# Patient Record
Sex: Female | Born: 1965 | Race: Asian | Hispanic: No | Marital: Single | Smoking: Never smoker
Health system: Southern US, Community
[De-identification: ages and names within clinical notes are randomized; demographics above are authoritative.]

## PROBLEM LIST (undated history)

## (undated) DIAGNOSIS — E039 Hypothyroidism, unspecified: Secondary | ICD-10-CM

## (undated) DIAGNOSIS — E785 Hyperlipidemia, unspecified: Secondary | ICD-10-CM

## (undated) DIAGNOSIS — E162 Hypoglycemia, unspecified: Secondary | ICD-10-CM

## (undated) DIAGNOSIS — R7303 Prediabetes: Secondary | ICD-10-CM

## (undated) DIAGNOSIS — K219 Gastro-esophageal reflux disease without esophagitis: Secondary | ICD-10-CM

## (undated) DIAGNOSIS — G4700 Insomnia, unspecified: Secondary | ICD-10-CM

## (undated) DIAGNOSIS — M797 Fibromyalgia: Secondary | ICD-10-CM

## (undated) HISTORY — DX: Hypothyroidism, unspecified: E03.9

## (undated) HISTORY — DX: Prediabetes: R73.03

## (undated) HISTORY — DX: Fibromyalgia: M79.7

## (undated) HISTORY — PX: FACIAL RECONSTRUCTION SURGERY: SHX631

## (undated) HISTORY — DX: Hypoglycemia, unspecified: E16.2

## (undated) HISTORY — PX: APPENDECTOMY: SHX54

## (undated) HISTORY — PX: LAPAROTOMY: SHX154

## (undated) HISTORY — DX: Insomnia, unspecified: G47.00

## (undated) HISTORY — DX: Hyperlipidemia, unspecified: E78.5

## (undated) HISTORY — PX: UTERINE FIBROID SURGERY: SHX826

## (undated) HISTORY — PX: VAGINAL HYSTERECTOMY: SUR661

---

## 2001-10-18 ENCOUNTER — Other Ambulatory Visit: Admission: RE | Admit: 2001-10-18 | Discharge: 2001-10-18 | Payer: Self-pay | Admitting: *Deleted

## 2001-11-14 ENCOUNTER — Encounter: Admission: RE | Admit: 2001-11-14 | Discharge: 2001-11-14 | Payer: Self-pay | Admitting: *Deleted

## 2001-11-14 ENCOUNTER — Encounter: Payer: Self-pay | Admitting: *Deleted

## 2002-05-01 ENCOUNTER — Other Ambulatory Visit: Admission: RE | Admit: 2002-05-01 | Discharge: 2002-05-01 | Payer: Self-pay | Admitting: *Deleted

## 2002-08-15 ENCOUNTER — Inpatient Hospital Stay (HOSPITAL_COMMUNITY): Admission: RE | Admit: 2002-08-15 | Discharge: 2002-08-17 | Payer: Self-pay | Admitting: *Deleted

## 2002-08-15 ENCOUNTER — Encounter (INDEPENDENT_AMBULATORY_CARE_PROVIDER_SITE_OTHER): Payer: Self-pay | Admitting: Specialist

## 2002-12-19 ENCOUNTER — Other Ambulatory Visit: Admission: RE | Admit: 2002-12-19 | Discharge: 2002-12-19 | Payer: Self-pay | Admitting: *Deleted

## 2004-09-29 ENCOUNTER — Ambulatory Visit: Payer: Self-pay | Admitting: Physician Assistant

## 2010-01-21 ENCOUNTER — Ambulatory Visit (HOSPITAL_BASED_OUTPATIENT_CLINIC_OR_DEPARTMENT_OTHER): Admission: RE | Admit: 2010-01-21 | Discharge: 2010-01-21 | Payer: Self-pay | Admitting: Rheumatology

## 2010-01-25 ENCOUNTER — Ambulatory Visit: Payer: Self-pay | Admitting: Internal Medicine

## 2011-05-07 NOTE — Discharge Summary (Signed)
   [name], [name]                          ACCOUNT NO.:  [id number]   MEDICAL RECORD NO.:  [id number]                   PATIENT TYPE:  INP   LOCATION:  9311                                 FACILITY:  [facility]   PHYSICIAN:  Gerri Spore B. Earlene Plater, M.D.               DATE OF BIRTH:  10-28-1966   DATE OF ADMISSION:  08/15/2002  DATE OF DISCHARGE:  08/17/2002                                 DISCHARGE SUMMARY   ADMISSION DIAGNOSIS:  Chronic pelvic pain, history of pelvic adhesions and  endometriosis.   DISCHARGE DIAGNOSIS:  Chronic pelvic pain, history of pelvic adhesions and  endometriosis.   PROCEDURE:  Exploratory laparotomy, lysis of adhesions, appendectomy.   HISTORY OF PRESENT ILLNESS:  A 45 year old [place] female gravida 0 with a  long history of endometriosis previously diagnosed by laparoscopy and known  history of dense pelvic adhesions from the rectosigmoid to the posterior  uterus.  The patient presented for conservative surgical management of  recurrent and chronic pelvic pain.   HOSPITAL COURSE:  On day of admission the patient underwent the above  procedure.  Findings at the time of surgery included multiple small bowel  adhesions, adhesions of the distal descending and proximal sigmoid colon to  the uterine fundus, and dense adhesions of the sigmoid and upper rectum to  the uterus in the posterior cul-de-sac; atrophic-appearing ovaries.  Tubes  appeared grossly distorted due to adhesions.  The small bowel adhesions were  dissected sharply per Dr. Carolynne Edouard.  He also performed appendectomy.  The dense  adhesions in the posterior cul-de-sac could not be dissected sharply due to  concern over need for colostomy versus segmental resection and  reanastomosis.   Postoperatively, the patient rapidly regained her ability to ambulate and  void.  She had a mild postoperative ileus which resolved by postoperative  day #2 with expectant management.  She was discharged home on  postoperative  day #2 in satisfactory condition.   DISCHARGE MEDICATIONS:  1. Talacen one p.o. q.4h. p.r.n. pain.  2. Fosamax 70 mg p.o. every week.   DISCHARGE INSTRUCTIONS:  1. No heavy lifting for six weeks, no driving for two weeks.  2. Notify for fever, pain, nausea, vomiting, or concern regarding wound.    FOLLOW-UP:  At the office, Dr. Earlene Plater, in four weeks.   DISPOSITION AT DISCHARGE:  Satisfactory.                                                Gerri Spore B. Earlene Plater, M.D.    WBD/MEDQ  D:  08/17/2002  T:  08/18/2002  Job:  [id number]   cc:   Ollen Gross. Vernell Morgans, M.D.  Fax: [phone]

## 2011-05-07 NOTE — Op Note (Signed)
[name], [name]                          ACCOUNT NO.:  [id number]   MEDICAL RECORD NO.:  [id number]                   PATIENT TYPE:  INP   LOCATION:  9311                                 [facility]ACILITY:  [facility]   PHYSICIAN:  Ollen Gross. Vernell Morgans, M.D.              DATE OF BIRTH:  04-02-1966   DATE OF PROCEDURE:  08/15/2002  DATE OF DISCHARGE:                                 OPERATIVE REPORT   PREOPERATIVE DIAGNOSIS:  Chronic pelvic pain, possible endometriosis.   POSTOPERATIVE DIAGNOSIS:  Chronic pelvic pain with adhesions.   PROCEDURES:  1. Exploratory laparotomy.  2. Lysis of adhesions.   SURGEON:  Ollen Gross. Carolynne Edouard, M.D.   ASSISTANT:  Chester Holstein. Earlene Plater, M.D.   ANESTHESIA:  General endotracheal.   PROCEDURE:  After informed consent was obtained, the patient was brought to  the operating room, placed in the supine position on the operating table.  After adequate induction of general anesthesia, the patient's abdomen was  prepped with Betadine and draped in the usual sterile manner.  A lower  midline incision was made with a 10 blade knife.  This incision was carried  down to the skin and subcutaneous tissues with the Bovie electrocautery  until the linea alba was identified.  The linea alba was also incised with  M__________  electrocautery and careful blunt dissection was then carried  out in the preperitoneal space until access was gained of the abdominal  cavity.  At this point, the abdominal cavity was probed and there appeared  to be no adhesions to the anterior abdominal wall.  The rest of the incision  was then opened.  Under direct vision, a Balfour retractor was placed with a  bladder blade to help retract the edges of the wound laterally and  inferiorly.  At this point, the omentum was able to be elevated and the  small bowel underneath it appeared to be very free and mobile.  On following  the small bowel down into the pelvis, it appeared to have some significant  adhesions  of small bowel to the top of the uterus and to other loops of  bowel.  Very tedious careful dissection both bluntly and sharply with the  Metzenbaum scissors was carried out taking about an hour and a half's worth  of time.  The small bowel was able to be freed completely from the top of  the uterus so that the small bowel was free from the ligament of Treitz to  the ileocecal valve.  Because of the amount of adhesions, it was felt that  it was safer to do an appendectomy because returning to this area if she  should develop an appendicitis in the future would be extremely difficult.  The appendix was long and thin and was in a retrocecal position.  This was  freed up sharply with the Metzenbaum scissors.  The mesoappendix was then  taken  down by clamping with hemostats, dividing and ligating with 3-0 silk  ties.  Once the base of the appendix was completely free, a straight  hemostat was placed across the base of the appendix and then moved distally  a couple of mm and reclamped.  A 0 chromic was then tied along the base of  the appendix where the prior clamp had been.  The appendix was then divided  and removed from the patient.  The appendiceal stump was then dunked with a  Z-stitch of 3-0 silk.  Attention was then turned to the sigmoid colon which  was significantly adherent to the lateral side wall as well as the edge of  the uterus and after careful dissection, this was able to be freed.  The  ureters were identified on both sides and care was taken to stay away from  the course of the ureter.  The rectum appeared to be very intimately  adherent to the back wall of the uterus.  There was no obvious evidence of  endometriosis in the pelvis and because of the significant adhesion deep  within the pelvis between the uterus and the rectum, it was decided to stop  the dissection at this point rather than risk any injury to the rectum.  Certainly should she need to have a hysterectomy in  future, this dissection  will be very difficult, but she was not ready for this much of an operation  today.  Some Interceed barrier was placed on the dome of the uterus.  Prior  to that, the entered abdomen was irrigated with copious amounts of saline.  The pelvis was examined and found to be hemostatic.  The Interceed was then  placed on the dome of the uterus and the bowel was allowed to fall back in  place and covered with the omentum.  The abdominal wall was then closed with  two running 1 PDS sutures.  The subcutaneous tissue was then irrigated with  copious amounts of saline and the skin was closed with a running 4-0 Vicryl  subcuticular stitch.  Sterile dressings were applied.  The patient tolerated  the procedure well.  At the end of the case, all sponge, instrument, and  needle counts were correct.  The patient was awakened and taken to the  recovery room in stable condition.                                               Ollen Gross. Vernell Morgans, M.D.    PST/MEDQ  D:  08/15/2002  T:  08/15/2002  Job:  [id number]

## 2011-05-07 NOTE — H&P (Signed)
[name], [name]                          ACCOUNT NO.:  [id number]   MEDICAL RECORD NO.:  [id number]                   PATIENT TYPE:  INP   LOCATION:  NA                                   [facility]ILITY[facility]  [facility]   PHYSICIAN:  [place] B. Earlene Plater, M.D.               DATE OF BIRTH:  [birth year]   DATE OF ADMISSION:  08/15/2002  DATE OF DISCHARGE:                                HISTORY & PHYSICAL   PREOPERATIVE DIAGNOSES:  1. Endometriosis.  2. Recurrent pelvic pain.   INTENDED PROCEDURE:  Exploratory laparotomy, lysis of adhesions, resection  of endometriosis, potential for bowel resection.   HISTORY OF PRESENT ILLNESS:  The patient is a 45 year old [place] female,  gravida 0, with a long history of endometriosis previously diagnosed by  laparoscopy and found to be very advanced with adhesions from the  rectosigmoid to the posterior uterus.  The patient has been managed with  Depo-Lupron in the past, however, this led to osteoporosis.  Has  subsequently been managed with birth control pills, nonsteroidals, and  narcotic pain medication.  The patient has continued severe pain and  requests surgical treatment.  Would like to retain her uterus for future  child-bearing if at all possible, but expressed understanding that  hysterectomy may be the only way to resect endometriosis causing her pain.   PAST MEDICAL HISTORY:  Endometriosis as outlined above.   PAST SURGICAL HISTORY:  1. Myomectomy x2.  2. LEEP for cervical dysplasia.   MEDICATIONS:  1. Fosamax 70 mg a week.  2. Talwin for pain.   ALLERGIES:  To most CODEINE BASED ANALGESICS, PAXIL, ZOLOFT.   FAMILY HISTORY:  Hypertension, diabetes, hypertension, and heart disease.   REVIEW OF SYMPTOMS:  Otherwise negative.   PHYSICAL EXAMINATION:  VITAL SIGNS:  Blood pressure 140/82, pulse 84,  temperature 98.6.  GENERAL:  Alert and oriented, in no acute distress.  SKIN:  Warm and dry, no lesions.  HEART:  Regular rate and rhythm.  LUNGS:  Clear to auscultation.  ABDOMEN:  No inguinal hernia, moderate bilateral lower abdominal pain.  PELVIC:  Normal external genitalia and cervix normal.  Uterus is fixed, non-  mobile, and no adnexal masses are palpable.   ASSESSMENT:  Recurrent endometriosis with associated pain.  The patient  desires surgical treatment.  She is most interested in conservative therapy,  preserving uterus and ovary if possible, however, has expressed willingness  to proceed with hysterectomy if it is the only way to optimally remove the  endometriosis.  The patient has been made aware for potential need for bowel  resection, and will have a bowel prep preoperatively, and will have surgery  performed in conjunction with Dr. Carolynne Edouard of general surgery, anticipating this  likelihood.  Operative risks discussed, including infection, bleeding,  damage to bowel, bladder, or surrounding organs.  The patient wishes to  proceed.  Gerri Spore B. Earlene Plater, M.D.    WBD/MEDQ  D:  07/27/2002  T:  07/28/2002  Job:  [id number]   cc:   Ollen Gross. Vernell Morgans, M.D.  Fax: [phone]

## 2020-03-25 ENCOUNTER — Other Ambulatory Visit: Payer: Self-pay | Admitting: Obstetrics & Gynecology

## 2020-03-27 ENCOUNTER — Encounter: Payer: Self-pay | Admitting: Gastroenterology

## 2020-04-01 ENCOUNTER — Other Ambulatory Visit: Payer: Self-pay | Admitting: Gastroenterology

## 2020-04-29 ENCOUNTER — Ambulatory Visit: Payer: Self-pay | Admitting: Gastroenterology

## 2020-05-05 NOTE — Progress Notes (Signed)
Called to ask about study. Pt states she wishes to just have the manometry not the PH study that was scheduled. Sent message to dr. Rozanna Boer to let him know.

## 2020-05-06 ENCOUNTER — Emergency Department
Admission: EM | Admit: 2020-05-06 | Discharge: 2020-05-06 | Disposition: A | Discharge status: Home / Self Care | Payer: 59 | Source: Home / Self Care | Attending: Family Medicine | Admitting: Family Medicine

## 2020-05-06 ENCOUNTER — Other Ambulatory Visit: Payer: Self-pay

## 2020-05-06 DIAGNOSIS — M791 Myalgia, unspecified site: Secondary | ICD-10-CM | POA: Diagnosis not present

## 2020-05-06 DIAGNOSIS — K209 Esophagitis, unspecified without bleeding: Secondary | ICD-10-CM

## 2020-05-06 DIAGNOSIS — K121 Other forms of stomatitis: Secondary | ICD-10-CM | POA: Diagnosis not present

## 2020-05-06 HISTORY — DX: Gastro-esophageal reflux disease without esophagitis: K21.9

## 2020-05-06 LAB — POCT CBC W AUTO DIFF (K'VILLE URGENT CARE)

## 2020-05-06 LAB — POCT URINALYSIS DIP (MANUAL ENTRY)
Bilirubin, UA: NEGATIVE
Blood, UA: NEGATIVE
Glucose, UA: NEGATIVE mg/dL
Ketones, POC UA: NEGATIVE mg/dL
Leukocytes, UA: NEGATIVE
Nitrite, UA: NEGATIVE
Protein Ur, POC: NEGATIVE mg/dL
Spec Grav, UA: 1.02 (ref 1.010–1.025)
Urobilinogen, UA: 0.2 E.U./dL
pH, UA: 6 (ref 5.0–8.0)

## 2020-05-06 MED ORDER — SUCRALFATE 1 GM/10ML PO SUSP
1.0000 g | Freq: Four times a day (QID) | ORAL | 1 refills | Status: DC
Start: 2020-05-06 — End: 2021-09-22

## 2020-05-06 MED ORDER — SODIUM CHLORIDE 0.9 % IV BOLUS
1000.0000 mL | Freq: Once | INTRAVENOUS | Status: AC
  Administered 2020-05-06: 1000 mL via INTRAVENOUS

## 2020-05-06 MED ORDER — LIDOCAINE VISCOUS HCL 2 % MT SOLN
15.0000 mL | Freq: Once | OROMUCOSAL | Status: AC
  Administered 2020-05-06: 15 mL via ORAL

## 2020-05-06 MED ORDER — ALUM & MAG HYDROXIDE-SIMETH 200-200-20 MG/5ML PO SUSP
30.0000 mL | Freq: Once | ORAL | Status: AC
  Administered 2020-05-06: 30 mL via ORAL

## 2020-05-06 NOTE — ED Triage Notes (Signed)
Patient presents to Urgent Care with complaints of sore throat and generalized body aches since she had an endoscopy four days ago. Patient reports her pain in her throat is worse when swallowing even though she is on a liquid diet. Pt has been pulling forward on her neck when she swallows to ease the pain and her neck hurts by the end of the day. Patient states she feels dehydrated.

## 2020-05-06 NOTE — Discharge Instructions (Addendum)
Try Biotene oral rinse for dry mouth.  Increase oral intake as tolerated.  If symptoms become significantly worse during the night or over the weekend, proceed to the local emergency room.
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
## 2020-05-07 LAB — COMPLETE METABOLIC PANEL WITH GFR
AG Ratio: 1.7 (calc) (ref 1.0–2.5)
ALT: 8 U/L (ref 6–29)
AST: 13 U/L (ref 10–35)
Albumin: 4.2 g/dL (ref 3.6–5.1)
Alkaline phosphatase (APISO): 52 U/L (ref 37–153)
BUN/Creatinine Ratio: 9 (calc) (ref 6–22)
BUN: 5 mg/dL — ABNORMAL LOW (ref 7–25)
CO2: 24 mmol/L (ref 20–32)
Calcium: 9.4 mg/dL (ref 8.6–10.4)
Chloride: 106 mmol/L (ref 98–110)
Creat: 0.54 mg/dL (ref 0.50–1.05)
GFR, Est African American: 125 mL/min/{1.73_m2} (ref >=60)
GFR, Est Non African American: 108 mL/min/{1.73_m2} (ref >=60)
Globulin: 2.5 g/dL (calc) (ref 1.9–3.7)
Glucose, Bld: 105 mg/dL — ABNORMAL HIGH (ref 65–99)
Potassium: 3.8 mmol/L (ref 3.5–5.3)
Sodium: 140 mmol/L (ref 135–146)
Total Bilirubin: 0.7 mg/dL (ref 0.2–1.2)
Total Protein: 6.7 g/dL (ref 6.1–8.1)

## 2020-05-07 LAB — CK: Total CK: 43 U/L (ref 29–143)

## 2020-05-08 ENCOUNTER — Telehealth: Payer: Self-pay

## 2020-05-08 LAB — URINE CULTURE
MICRO NUMBER:: 10490472
Result:: NO GROWTH
SPECIMEN QUALITY:: ADEQUATE

## 2020-05-08 NOTE — Telephone Encounter (Signed)
Pt called to get a further explanation of lab work. Pt also requested to be sent to PCP. Advised PCP may be able to see or she would need to come pick up labs. Pt advised labs were mostly normal, may repeat at PCP office if sxs persist.

## 2020-05-10 ENCOUNTER — Other Ambulatory Visit (HOSPITAL_COMMUNITY): Payer: Self-pay

## 2020-07-02 ENCOUNTER — Ambulatory Visit (HOSPITAL_COMMUNITY): Admit: 2020-07-02 | Payer: 59 | Admitting: Gastroenterology

## 2020-07-02 ENCOUNTER — Encounter (HOSPITAL_COMMUNITY): Payer: Self-pay

## 2020-07-02 SURGERY — MANOMETRY, ESOPHAGUS

## 2020-10-29 ENCOUNTER — Telehealth: Payer: Self-pay | Admitting: Gastroenterology

## 2020-10-29 NOTE — Telephone Encounter (Signed)
Ok, please schedule next available appointment.  Thank you

## 2020-10-29 NOTE — Telephone Encounter (Signed)
Hey Dr Lavon Paganini, this pt is being referred by [facility] (Dr Opal Sidles) for SIBO, he last saw this GI 10/02/2020, records are in epic for review, please advise on scheduling.

## 2020-10-31 ENCOUNTER — Ambulatory Visit (HOSPITAL_COMMUNITY)
Admission: RE | Admit: 2020-10-31 | Discharge: 2020-10-31 | Disposition: A | Discharge status: Home / Self Care | Payer: 59 | Source: Ambulatory Visit | Attending: Obstetrics & Gynecology | Admitting: Obstetrics & Gynecology

## 2020-10-31 ENCOUNTER — Other Ambulatory Visit: Payer: Self-pay

## 2020-10-31 ENCOUNTER — Other Ambulatory Visit (HOSPITAL_COMMUNITY): Payer: Self-pay | Admitting: Obstetrics & Gynecology

## 2020-10-31 ENCOUNTER — Encounter: Payer: Self-pay | Admitting: Gastroenterology

## 2020-10-31 DIAGNOSIS — R252 Cramp and spasm: Secondary | ICD-10-CM | POA: Insufficient documentation

## 2020-11-03 ENCOUNTER — Other Ambulatory Visit: Payer: Self-pay

## 2020-11-03 ENCOUNTER — Emergency Department
Admission: EM | Admit: 2020-11-03 | Discharge: 2020-11-03 | Disposition: A | Discharge status: Home / Self Care | Payer: 59 | Source: Home / Self Care

## 2020-11-03 ENCOUNTER — Emergency Department (INDEPENDENT_AMBULATORY_CARE_PROVIDER_SITE_OTHER): Payer: 59

## 2020-11-03 ENCOUNTER — Emergency Department: Admission: EM | Admit: 2020-11-03 | Discharge: 2020-11-03 | Discharge status: Absent without leave | Payer: Self-pay

## 2020-11-03 DIAGNOSIS — B9689 Other specified bacterial agents as the cause of diseases classified elsewhere: Secondary | ICD-10-CM

## 2020-11-03 DIAGNOSIS — R6883 Chills (without fever): Secondary | ICD-10-CM

## 2020-11-03 DIAGNOSIS — M79622 Pain in left upper arm: Secondary | ICD-10-CM

## 2020-11-03 DIAGNOSIS — M79602 Pain in left arm: Secondary | ICD-10-CM

## 2020-11-03 DIAGNOSIS — M25512 Pain in left shoulder: Secondary | ICD-10-CM

## 2020-11-03 DIAGNOSIS — M791 Myalgia, unspecified site: Secondary | ICD-10-CM

## 2020-11-03 DIAGNOSIS — J019 Acute sinusitis, unspecified: Secondary | ICD-10-CM | POA: Diagnosis not present

## 2020-11-03 DIAGNOSIS — S00412A Abrasion of left ear, initial encounter: Secondary | ICD-10-CM

## 2020-11-03 MED ORDER — AMOXICILLIN-POT CLAVULANATE 875-125 MG PO TABS: 1.0000 | ORAL_TABLET | Freq: Two times a day (BID) | ORAL | 0 refills | Status: DC

## 2020-11-03 MED ORDER — NEOMYCIN-POLYMYXIN-HC 3.5-10000-1 OT SUSP
3.0000 [drp] | Freq: Three times a day (TID) | OTIC | 0 refills | Status: AC
Start: 2020-11-03 — End: 2020-11-10

## 2020-11-03 NOTE — ED Triage Notes (Signed)
Pt c/o LT ear pain x 2 days. Noticed some blood on q tip this am. Some facial pain, specifically behind her eyes. Also c/o chest congestion. Mucinex BID x 2 weeks. Hx of reflux. Also c/o muscles aches x several months, worsening in last week.

## 2020-11-03 NOTE — ED Provider Notes (Addendum)
[facility]    CSN: [id number] Arrival date & time: 11/03/20  0902      History   Chief Complaint Chief Complaint  Patient presents with  . Generalized Body Aches  . Otalgia    LT ear  . Nasal Congestion    Chest congestion    HPI [name] is a 54 y.o. female.   HPI  [name] is a 54 y.o. female presenting to UC with several concerns.  Congestion/ear pain: pt c/o several weeks of congestion including post-nasal drainage and sinus pressure.  She has been taking mucinex and zyrtec with mild relief.  Two days ago, she developed Left ear pain and noticed blood on a Q-tip this morning.  Associated facial pain behind her eyes but denies change in vision.  Mild chest congestion with cough but denies chest pain or SOB. Denies fever, chills, n/v/d.   Left upper arm pain and leg "coolness": pt was seen by PCP on 10/21/20 for same. She had nasal congestion at that time, was advised to continue the mucinex and zyrtec as needed. She was also evaluated for the arm pain, referred to cardiology, who ruled out cardiac cause at this time but pt is scheduled for a cardiac CT tomorrow.  PCP also encouraged use of Tylenol and baclofen for muscle spasms. She initially thought the arm pain was muscular but states "it feels like it's in the bone now." Per medical records, pt developed a bad headache on the top of her head, had nausea, numbness and heaviness in Left arm. Pt has not been evaluated by neurology. Today, pt states she attributed those symptoms to low blood sugar and states the HA was gradual in onset and not severe. intermittent headaches since then.  HA today is mild sinus pressure but denies change in vision or dizziness. Pt medical records, pt had an MRI of her neck on 04/04/20 due to pressure and pain in throat, ear and chest.  MRI was normal at that time. She is concerned her arm pain is from having the IV placed for that procedure.  Denies neck pain today.    Past  Medical History:  Diagnosis Date  . GERD (gastroesophageal reflux disease)     There are no problems to display for this patient.   History reviewed. No pertinent surgical history.  OB History   No obstetric history on file.      Home Medications    Prior to Admission medications   Medication Sig Start Date End Date Taking? Authorizing Provider  famotidine (PEPCID) 40 MG tablet Take by mouth. 04/22/20  Yes [provider]  zolpidem (AMBIEN) 10 MG tablet Take by mouth. 07/24/13  Yes [provider]  amoxicillin-clavulanate (AUGMENTIN) 875-125 MG tablet Take 1 tablet by mouth 2 (two) times daily. One po bid x 7 days 11/03/20   Lurene Shadow, PA-C  atenolol (TENORMIN) 25 MG tablet Take 12.5 mg by mouth daily.    [provider]  baclofen (LIORESAL) 10 MG tablet Take 5 mg by mouth 3 (three) times daily.    [provider]  estradiol (CLIMARA - DOSED IN MG/24 HR) 0.1 mg/24hr patch Place 0.1 mg onto the skin once a week.    [provider]  levothyroxine (SYNTHROID) 50 MCG tablet Take 50 mcg by mouth daily before breakfast.    [provider]  neomycin-polymyxin-hydrocortisone (CORTISPORIN) 3.5-10000-1 OTIC suspension Place 3 drops into the left ear 3 (three) times daily for 7 days.  11/03/20 11/10/20  Lurene ShadowPhelps, Giordana Weinheimer O, PA-C  sucralfate (CARAFATE) 1 GM/10ML suspension Take 10 mLs (1 g total) by mouth 4 (four) times daily for 21 days. 05/06/20 05/27/20  Lattie HawBeese, Stephen A, MD  zolpidem (AMBIEN) 10 MG tablet Take 10 mg by mouth at bedtime as needed for sleep.    [provider]    Family History Family History  Problem Relation Age of Onset  . Diabetes Mother   . Hypertension Mother   . Heart failure Mother   . Hypertension Father     Social History Social History   Tobacco Use  . Smoking status: Never Smoker  . Smokeless tobacco: Never Used  Substance Use Topics  . Alcohol use: Not Currently  . Drug use: Not on file      Allergies   Other   Review of Systems Review of Systems  Constitutional: Negative for chills and fever.  HENT: Positive for congestion, ear pain, sinus pressure and sore throat. Negative for trouble swallowing and voice change.   Eyes: Negative for photophobia and visual disturbance.  Respiratory: Positive for cough (mild). Negative for shortness of breath.   Cardiovascular: Negative for chest pain and palpitations.  Gastrointestinal: Negative for abdominal pain, diarrhea, nausea and vomiting.  Musculoskeletal: Positive for arthralgias and myalgias. Negative for back pain.       Left upper arm pain  Skin: Negative for rash.  Neurological: Positive for headaches. Negative for dizziness, weakness, light-headedness and numbness.       "coolness sensation" in arms and legs  All other systems reviewed and are negative.    Physical Exam Triage Vital Signs ED Triage Vitals  Enc Vitals Group     BP 11/03/20 0918 130/79     Pulse Rate 11/03/20 0918 90     Resp 11/03/20 0918 18     Temp 11/03/20 0918 98.1 F (36.7 C)     Temp Source 11/03/20 0918 Oral     SpO2 11/03/20 0918 100 %     Weight --      Height --      Head Circumference --      Peak Flow --      Pain Score 11/03/20 0919 5     Pain Loc --      Pain Edu? --      Excl. in GC? --    No data found.  Updated Vital Signs BP 130/79 (BP Location: Right Arm)   Pulse 90   Temp 98.1 F (36.7 C) (Oral)   Resp 18   SpO2 100%   Visual Acuity Right Eye Distance:   Left Eye Distance:   Bilateral Distance:    Right Eye Near:   Left Eye Near:    Bilateral Near:     Physical Exam Vitals and nursing note reviewed.  Constitutional:      General: She is not in acute distress.    Appearance: Normal appearance. She is well-developed. She is not ill-appearing, toxic-appearing or diaphoretic.  HENT:     Head: Normocephalic and atraumatic.     Right Ear: Tympanic membrane and ear canal normal.     Left Ear: Tympanic  membrane and ear canal normal. Drainage present. No tenderness. There is no impacted cerumen. No foreign body.     Ears:     Comments: Left ear: scant red blood in canal. TM in tact.     Nose:     Right Sinus: Maxillary sinus tenderness present. No frontal sinus tenderness.  Left Sinus: Maxillary sinus tenderness present. No frontal sinus tenderness.     Mouth/Throat:     Lips: Pink.     Mouth: Mucous membranes are moist.     Pharynx: Oropharynx is clear. Uvula midline. No pharyngeal swelling, oropharyngeal exudate, posterior oropharyngeal erythema or uvula swelling.  Eyes:     Extraocular Movements: Extraocular movements intact.     Conjunctiva/sclera: Conjunctivae normal.     Pupils: Pupils are equal, round, and reactive to light.  Cardiovascular:     Rate and Rhythm: Normal rate and regular rhythm.  Pulmonary:     Effort: Pulmonary effort is normal. No respiratory distress.     Breath sounds: Normal breath sounds. No stridor. No wheezing, rhonchi or rales.  Musculoskeletal:        General: Tenderness (left shoulder and left upper arm) present. No swelling. Normal range of motion.     Cervical back: Normal range of motion and neck supple. No tenderness.     Comments: No spinal tenderness. Full ROM upper and lower extremities with 5/5 strength. Normal gait.   Lymphadenopathy:     Cervical: No cervical adenopathy.  Skin:    General: Skin is warm and dry.     Capillary Refill: Capillary refill takes less than 2 seconds.  Neurological:     General: No focal deficit present.     Mental Status: She is alert and oriented to person, place, and time.     Cranial Nerves: No cranial nerve deficit.     Sensory: No sensory deficit.     Motor: No weakness.     Coordination: Coordination normal.     Gait: Gait normal.  Psychiatric:        Mood and Affect: Mood normal.        Behavior: Behavior normal.      UC Treatments / Results  Labs (all labs ordered are listed, but only  abnormal results are displayed) Labs Reviewed - No data to display  EKG   Radiology DG Shoulder Left  Result Date: 11/03/2020 CLINICAL DATA:  Left upper extremity pain and weakness since May 2021. No injury. EXAM: LEFT SHOULDER - 2+ VIEW COMPARISON:  None. FINDINGS: There is no evidence of fracture or dislocation. There is no evidence of arthropathy or other focal bone abnormality. Soft tissues are unremarkable. IMPRESSION: Negative. Electronically Signed   By: Elberta Fortis M.D.   On: 11/03/2020 12:42   DG Humerus Left  Result Date: 11/03/2020 CLINICAL DATA:  Left upper arm pain intermittently since May 2021. No injury. EXAM: LEFT HUMERUS - 2+ VIEW COMPARISON:  None. FINDINGS: There is no evidence of fracture or other focal bone lesions. Soft tissues are unremarkable. IMPRESSION: Negative. Electronically Signed   By: Elberta Fortis M.D.   On: 11/03/2020 12:42    Procedures Procedures (including critical care time)  Medications Ordered in UC Medications - No data to display  Initial Impression / Assessment and Plan / UC Course  I have reviewed the triage vital signs and the nursing notes.  Pertinent labs & imaging results that were available during my care of the patient were reviewed by me and considered in my medical decision making (see chart for details).    Hx and exam c/w bacterial sinusitis given sinus tenderness and duration of congestion.  Rx: augmentin Ear exam, left- scant red blood in canal c/w superficial abrasion, will tx with cortisporin drops   Left arm pain- plain films ordered as pt c/o worsening pain over the last  2 weeks, "feels like its in the bone."  No bony abnormality noted today. Reviewed PMH and discussed HA that started around same time as arm pain. Pt declined CT head today, pt has cardiac CT tomorrow, pt concerned for radiation.  Normal neuro exam today in UC.  Pt does note concern for MS, no family hx. Due to reports of "chills" in legs, continued  Left upper arm pain and headaches, will place referral to neurology.  Discussed symptoms that warrant emergent care in the ED. AVS given   Final Clinical Impressions(s) / UC Diagnoses   Final diagnoses:  Acute bacterial sinusitis  Abrasion of left ear canal, initial encounter  Myalgia  Left shoulder pain  Left arm pain  Chills     Discharge Instructions      Please take antibiotics as prescribed and be sure to complete entire course even if you start to feel better to ensure infection does not come back.  Call to schedule a follow up appointment with your primary care provider later this week or next week for recheck of symptoms and further evaluation of leg and arm pain and chills.  You may also try to schedule a follow up appointment with neurology or your endocrinologist to discuss your arm pain and chilling sensation in your arms and legs.  Be sure to keep your cardiology appointment for the CT tomorrow.    Call 911 or have someone drive you to the hospital if symptoms significantly worsening including severe headache, change in vision, weakness in arms or legs especially if one sided, or other new concerning symptoms develop.      ED Prescriptions    Medication Sig Dispense Auth. Provider   amoxicillin-clavulanate (AUGMENTIN) 875-125 MG tablet Take 1 tablet by mouth 2 (two) times daily. One po bid x 7 days 14 tablet Zaya Kessenich O, PA-C   neomycin-polymyxin-hydrocortisone (CORTISPORIN) 3.5-10000-1 OTIC suspension Place 3 drops into the left ear 3 (three) times daily for 7 days. 10 mL Lurene Shadow, PA-C     PDMP not reviewed this encounter.   Lurene Shadow, PA-C 11/03/20 1642    Lurene Shadow, PA-C 11/03/20 1643

## 2020-11-03 NOTE — ED Notes (Signed)
Pt was requested to return for further eval after provider further reviewed other PCP and specialists notes from previous visits regarding todays complaints.

## 2020-11-03 NOTE — Discharge Instructions (Addendum)
  Please take antibiotics as prescribed and be sure to complete entire course even if you start to feel better to ensure infection does not come back.  Call to schedule a follow up appointment with your primary care provider later this week or next week for recheck of symptoms and further evaluation of leg and arm pain and chills.  You may also try to schedule a follow up appointment with neurology or your endocrinologist to discuss your arm pain and chilling sensation in your arms and legs.  Be sure to keep your cardiology appointment for the CT tomorrow.    Call 911 or have someone drive you to the hospital if symptoms significantly worsening including severe headache, change in vision, weakness in arms or legs especially if one sided, or other new concerning symptoms develop.

## 2020-11-20 ENCOUNTER — Other Ambulatory Visit: Payer: Self-pay | Admitting: Neurology

## 2020-11-20 DIAGNOSIS — R2990 Unspecified symptoms and signs involving the nervous system: Secondary | ICD-10-CM

## 2020-11-24 ENCOUNTER — Other Ambulatory Visit: Payer: 59

## 2020-11-29 ENCOUNTER — Other Ambulatory Visit: Payer: 59

## 2020-12-01 ENCOUNTER — Other Ambulatory Visit: Payer: 59

## 2020-12-09 ENCOUNTER — Ambulatory Visit: Payer: 59

## 2020-12-22 ENCOUNTER — Other Ambulatory Visit: Payer: 59

## 2020-12-24 ENCOUNTER — Ambulatory Visit (INDEPENDENT_AMBULATORY_CARE_PROVIDER_SITE_OTHER): Payer: 59 | Admitting: Gastroenterology

## 2020-12-24 ENCOUNTER — Encounter: Payer: Self-pay | Admitting: Gastroenterology

## 2020-12-24 VITALS — BP 114/70 | HR 80 | Ht <= 58 in | Wt 88.0 lb

## 2020-12-24 DIAGNOSIS — R12 Heartburn: Secondary | ICD-10-CM | POA: Diagnosis not present

## 2020-12-24 DIAGNOSIS — K580 Irritable bowel syndrome with diarrhea: Secondary | ICD-10-CM

## 2020-12-24 DIAGNOSIS — K589 Irritable bowel syndrome without diarrhea: Secondary | ICD-10-CM | POA: Diagnosis not present

## 2020-12-24 DIAGNOSIS — R634 Abnormal weight loss: Secondary | ICD-10-CM

## 2020-12-24 DIAGNOSIS — K6389 Other specified diseases of intestine: Secondary | ICD-10-CM

## 2020-12-24 MED ORDER — RIFAXIMIN 550 MG PO TABS
550.0000 mg | ORAL_TABLET | Freq: Two times a day (BID) | ORAL | 0 refills | Status: DC
Start: 2020-12-24 — End: 2020-12-25

## 2020-12-24 NOTE — Patient Instructions (Addendum)
You have been scheduled for an endoscopy. Please follow written instructions given to you at your visit today. If you use inhalers (even only as needed), please bring them with you on the day of your procedure.   Take IBGard and FDgard three times a day as needed  We will send Xifaxan to your pharmacy to take twice a day for 14 days, we have  Submitted Xifaxan to [facility] because your insurance will not cover the medication  We have sent your demographic information and a prescription for Xifaxan to [facility]. This pharmacy is able to get medication approved through insurance and get you the lowest copay possible. If you have not heard from them within 1 week, please call our office at [phone] to let us know.  Take Benefiber 1 teaspoon 2-3 times daily with meals   Due to recent changes in healthcare laws, you may see the results of your imaging and laboratory studies on MyChart before your provider has had a chance to review them.  We understand that in some cases there may be results that are confusing or concerning to you. Not all laboratory results come back in the same time frame and the provider may be waiting for multiple results in order to interpret others.  Please give [place] 48 hours in order for your provider to thoroughly review all the results before contacting the office for clarification of your results.   I appreciate the  opportunity to care for you  Thank You   Marsa Aris , MD

## 2020-12-24 NOTE — Progress Notes (Signed)
[name]    [id number]    21-Aug-1966  Primary Care Physician:Dsa, Laurell Roof, MD  Referring Physician: Alferd Apa, MD [address] [place],  [place] [address]   Chief complaint:  GERD, Abdominal pain, SIBO  HPI:  55 yr-year-old very pleasant female here for new patient visit with complaints of abdominal bloating, GERD, abdominal pain, and excess gas. She was previously followed by GI in [place], has had extensive work-up which is unrevealing for any acute pathology  Complains of burning sensation in mid abdomen associated with excessive gas and bloating, worse after meals.  "She feels like the gas and the burning sensation goes all the way to her head"  She has been taking probiotics on and off in the past 1 year with no significant change in symptoms  She had colonoscopy with biopsies at [facility] in May 2021, full report not available in epic  Breath hydrogen test: positve  Hydrogen increase over baseline 37  (Normal <20)  Methane peak 11 (<10)   She was treated with 1 week course of amoxicillin, has not noticed any significant improvement  She takes Phazyme as needed for excessive gas, not sure if it helps or not.  Review of system positive for back pain, fatigue, cramps, and sleeping problem  She has had multiple imaging including MRI enterography, CT abdomen pelvis  No vomiting, melena or rectal bleeding.  Outpatient Encounter Medications as of 12/24/2020  Medication Sig  . AMBULATORY NON FORMULARY MEDICATION DGL Plus 1 capsule 2-3 times daily  . AMBULATORY NON FORMULARY MEDICATION GI Mocrobi-X Take 1 capsule twice a day as needed  . AMBULATORY NON FORMULARY MEDICATION Interface Plus 1 capsule 1-2 times daily as needed  . atenolol (TENORMIN) 25 MG tablet Take 12.5 mg by mouth daily.  . Calcium Carb-Cholecalciferol (CALCIUM 1000 + D) 1000-800 MG-UNIT TABS Take 1 tablet by mouth as needed.  . cholecalciferol (VITAMIN D3) 25 MCG (1000  UNIT) tablet Take 1 tablet by mouth. Three times a week as needed  . cyanocobalamin (,VITAMIN B-12,) 1000 MCG/ML injection Inject 1,000 mcg into the muscle. twice a month  . estradiol (ESTRACE) 1 MG tablet Take 0.5 mg by mouth daily.  . famotidine (PEPCID) 40 MG tablet Take by mouth.  . levothyroxine (SYNTHROID) 50 MCG tablet Take 50 mcg by mouth daily before breakfast.  . pantoprazole (PROTONIX) 20 MG tablet Take 1 tablet by mouth 2 (two) times daily as needed.  . pioglitazone (ACTOS) 15 MG tablet Take 1 tablet by mouth daily.  . pravastatin (PRAVACHOL) 20 MG tablet Take 20 mg by mouth daily.  . rifaximin (XIFAXAN) 550 MG TABS tablet Take 1 tablet by mouth in the morning and at bedtime.  . sucralfate (CARAFATE) 1 GM/10ML suspension Take 10 mLs (1 g total) by mouth 4 (four) times daily for 21 days. (Patient taking differently: Take 1 g by mouth as needed.)  . tiZANidine (ZANAFLEX) 4 MG tablet Take 1 tablet by mouth as needed.  . zolpidem (AMBIEN) 10 MG tablet Take 10 mg by mouth at bedtime as needed for sleep.  . [DISCONTINUED] amoxicillin-clavulanate (AUGMENTIN) 875-125 MG tablet Take 1 tablet by mouth 2 (two) times daily. One po bid x 7 days  . [DISCONTINUED] baclofen (LIORESAL) 10 MG tablet Take 5 mg by mouth 3 (three) times daily.  . [DISCONTINUED] estradiol (CLIMARA - DOSED IN MG/24 HR) 0.1 mg/24hr patch Place 0.1 mg onto the skin once a week.  . [  DISCONTINUED] zolpidem (AMBIEN) 10 MG tablet Take by mouth.   No facility-administered encounter medications on file as of 12/24/2020.    Allergies as of 12/24/2020 - Review Complete 12/24/2020  Allergen Reaction Noted  . Lexapro [escitalopram] Itching 12/24/2020  . Other  05/06/2020  . Soma [carisoprodol] Itching 12/24/2020  . Tramadol Itching 12/24/2020    Past Medical History:  Diagnosis Date  . Fibromyalgia   . GERD (gastroesophageal reflux disease)   . HLD (hyperlipidemia)   . Hypoglycemia   . Hypothyroidism   . Insomnia   .  Prediabetes     No past surgical history on file.  Family History  Problem Relation Age of Onset  . Diabetes Mother   . Hypertension Mother   . Heart failure Mother   . Hypertension Father     Social History   Socioeconomic History  . Marital status: Married    Spouse name: Not on file  . Number of children: Not on file  . Years of education: Not on file  . Highest education level: Not on file  Occupational History  . Not on file  Tobacco Use  . Smoking status: Never Smoker  . Smokeless tobacco: Never Used  Substance and Sexual Activity  . Alcohol use: Not Currently  . Drug use: Not on file  . Sexual activity: Not on file  Other Topics Concern  . Not on file  Social History Narrative  . Not on file   Social Determinants of Health   Financial Resource Strain: Not on file  Food Insecurity: Not on file  Transportation Needs: Not on file  Physical Activity: Not on file  Stress: Not on file  Social Connections: Not on file  Intimate Partner Violence: Not on file      Review of systems: All other review of systems negative except as mentioned in the HPI.   Physical Exam: Vitals:   12/24/20 1350  BP: 114/70  Pulse: 80   Body mass index is 18.55 kg/m. 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 All questions answered. The patient agrees to proceed.  Use FD guard and IBgard for dyspepsia and IBS symptoms up to 3 times daily as needed  Use Benefiber 1 teaspoon 3 times daily with meals to improve bowel habits and decrease flatulence   The patient was provided an opportunity to ask questions and all were answered. The patient agreed with the plan and demonstrated an understanding of the instructions.  Iona Beard , MD    CC: Alferd Apa, MD

## 2020-12-25 MED ORDER — RIFAXIMIN 550 MG PO TABS: 550.0000 mg | ORAL_TABLET | Freq: Two times a day (BID) | ORAL | 0 refills | Status: DC

## 2020-12-26 ENCOUNTER — Telehealth: Payer: Self-pay | Admitting: Gastroenterology

## 2020-12-26 NOTE — Telephone Encounter (Signed)
We have submitted xifaxan to [facility] to see if they can get it cheaper for the patient

## 2020-12-26 NOTE — Telephone Encounter (Signed)
Pt is requesting a cheaper alternative for her Xifaxan.

## 2021-01-06 ENCOUNTER — Encounter: Payer: Self-pay | Admitting: Gastroenterology

## 2021-01-06 NOTE — Telephone Encounter (Signed)
Pt called inquiring about status of this prescription. Pls call her.

## 2021-01-07 NOTE — Telephone Encounter (Signed)
Dr Lavon Paganini, [facility] has samples for this patient, but you had prescribed it for twice a day for 14 days  Is that what you wanted to do? For SIBO its usually three times a day for 14 days

## 2021-01-07 NOTE — Telephone Encounter (Signed)
[facility], Do we have samples of Xifaxan I can give pt?

## 2021-01-08 NOTE — Telephone Encounter (Signed)
We can hold off until after she completes all the testing. Thanks

## 2021-01-08 NOTE — Telephone Encounter (Signed)
Called patient to inform to hold off on taking the xifaxan until after her EGD in Feb

## 2021-01-13 ENCOUNTER — Telehealth: Payer: Self-pay | Admitting: Gastroenterology

## 2021-01-13 NOTE — Telephone Encounter (Signed)
Dr Lavon Paganini patient needs to cancel EGD/[facility] due to insurance reasons

## 2021-01-13 NOTE — Telephone Encounter (Signed)
ok 

## 2021-01-14 NOTE — Telephone Encounter (Signed)
Patient picked up samples of Xifaxan 42 tablets for treatment yesterday

## 2021-01-19 ENCOUNTER — Ambulatory Visit: Payer: 59

## 2021-01-23 ENCOUNTER — Encounter: Payer: 59 | Admitting: Gastroenterology

## 2021-02-13 ENCOUNTER — Ambulatory Visit
Admission: RE | Admit: 2021-02-13 | Discharge: 2021-02-13 | Disposition: A | Discharge status: Home / Self Care | Payer: 59 | Source: Ambulatory Visit | Attending: Neurology | Admitting: Neurology

## 2021-02-13 ENCOUNTER — Other Ambulatory Visit: Payer: Self-pay | Admitting: Neurology

## 2021-02-13 ENCOUNTER — Other Ambulatory Visit: Payer: Self-pay

## 2021-02-13 DIAGNOSIS — R2990 Unspecified symptoms and signs involving the nervous system: Secondary | ICD-10-CM | POA: Insufficient documentation

## 2021-03-19 ENCOUNTER — Ambulatory Visit (INDEPENDENT_AMBULATORY_CARE_PROVIDER_SITE_OTHER): Payer: 59 | Admitting: Otolaryngology

## 2021-04-03 ENCOUNTER — Ambulatory Visit (INDEPENDENT_AMBULATORY_CARE_PROVIDER_SITE_OTHER): Payer: 59 | Admitting: Otolaryngology

## 2021-04-19 ENCOUNTER — Encounter: Payer: Self-pay | Admitting: Emergency Medicine

## 2021-04-19 ENCOUNTER — Emergency Department
Admission: EM | Admit: 2021-04-19 | Discharge: 2021-04-19 | Disposition: A | Discharge status: Home / Self Care | Payer: 59 | Source: Home / Self Care

## 2021-04-19 ENCOUNTER — Other Ambulatory Visit: Payer: Self-pay

## 2021-04-19 DIAGNOSIS — Z4802 Encounter for removal of sutures: Secondary | ICD-10-CM

## 2021-04-19 NOTE — ED Triage Notes (Signed)
Patient had staples placed from laceration in head x 8 days at [facility] ED.  Patient is here to have staples removed.

## 2021-04-24 ENCOUNTER — Ambulatory Visit (INDEPENDENT_AMBULATORY_CARE_PROVIDER_SITE_OTHER): Payer: 59 | Admitting: Gastroenterology

## 2021-04-24 ENCOUNTER — Encounter: Payer: Self-pay | Admitting: Gastroenterology

## 2021-04-24 VITALS — BP 118/70 | HR 84 | Ht <= 58 in | Wt 90.1 lb

## 2021-04-24 DIAGNOSIS — K219 Gastro-esophageal reflux disease without esophagitis: Secondary | ICD-10-CM | POA: Diagnosis not present

## 2021-04-24 NOTE — Progress Notes (Signed)
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 It is unclear if her symptoms improved after taking Xifaxan, unable to obtain a clear answer though she does indicate overall improvement in how she feels.   She also started using "Prawal panchamrit rasa, ayurvedic medicine" She feels that the second she took Ayurvedic medicine  gas went away", she is having bitter taste as a side effect to the medicine and her practioner changed the formulary, she is waiting for her shipment. She is able to eat better, is tolerating food better.  No longer losing weight.  She feels she continues to have difficulty digesting certain foods She continues to have significant heartburn, especially worse when she lays down, she feels acid come up on in the right side of her throat, her gum and teeth hurt and her sputum turns white.  She also feels burning sensation in both her thighs, calves and arms when she starts having acid come up her throat. Pantoprazole and Pepcid makes her symptoms of acid reflux worse  She was recommended to undergo EGD along with 48-hour pH Bravo study, patient canceled the procedure that was scheduled during last visit  GI Hx: Breath hydrogen test: positve  Hydrogen increase over baseline 37  (Normal <20)  Methane peak 11 (<10)   She has had multiple imaging including MRI enterography, CT abdomen pelvis done by her previous GI in [place]  She had colonoscopy with biopsies at [facility] in May 2021, was normal . Full report not available in epic   Outpatient  Encounter Medications as of 04/24/2021  Medication Sig  . atenolol (TENORMIN) 25 MG tablet Take 12.5 mg by mouth daily.  . Calcium Carb-Cholecalciferol (CALCIUM 1000 + D) 1000-800 MG-UNIT TABS Take 1 tablet by mouth as needed.  . cholecalciferol (VITAMIN D3) 25 MCG (1000 UNIT) tablet Take 1 tablet by mouth. Three times a week as needed  . cyanocobalamin (,VITAMIN B-12,) 1000 MCG/ML injection Inject 1,000 mcg into the muscle. twice a month  . estradiol (ESTRACE) 1 MG tablet Take 0.5 mg by mouth daily.  . famotidine (PEPCID) 40 MG tablet Take by mouth.  . levothyroxine (SYNTHROID) 50 MCG tablet Take 50 mcg by mouth daily before breakfast.  . pantoprazole (PROTONIX) 20 MG tablet Take 1 tablet by mouth 2 (two) times daily as needed.  . pioglitazone (ACTOS) 15 MG tablet Take 1 tablet by mouth daily.  Marland Kitchen zolpidem (AMBIEN) 10 MG tablet Take 10 mg by mouth at bedtime as needed for sleep.  . pravastatin (PRAVACHOL) 20 MG tablet Take 20 mg by mouth daily.  . sucralfate (CARAFATE) 1 GM/10ML suspension Take 10 mLs (1 g total) by mouth 4 (four) times daily for 21 days. (Patient taking differently: Take 1 g by mouth as needed.)  . [DISCONTINUED] AMBULATORY NON FORMULARY MEDICATION DGL Plus 1 capsule 2-3 times daily (Patient not taking: Reported on 04/24/2021)  . [DISCONTINUED] AMBULATORY NON FORMULARY MEDICATION GI Mocrobi-X Take 1 capsule twice a day as needed  . [DISCONTINUED] AMBULATORY NON FORMULARY MEDICATION Interface Plus 1 capsule 1-2 times  daily as needed (Patient not taking: Reported on 04/24/2021)  . [DISCONTINUED] rifaximin (XIFAXAN) 550 MG TABS tablet Take 1 tablet by mouth in the morning and at bedtime. (Patient not taking: Reported on 04/24/2021)  . [DISCONTINUED] rifaximin (XIFAXAN) 550 MG TABS tablet Take 1 tablet (550 mg total) by mouth 2 (two) times daily. For 14 days (Patient not taking: Reported on 04/24/2021)   No facility-administered encounter medications on file as of 04/24/2021.    Allergies  as of 04/24/2021 - Review Complete 04/24/2021  Allergen Reaction Noted  . Gadolinium derivatives Hives 02/13/2021  . Lexapro [escitalopram] Itching 12/24/2020  . Other  05/06/2020  . Soma [carisoprodol] Itching 12/24/2020  . Tramadol Itching 12/24/2020    Past Medical History:  Diagnosis Date  . Fibromyalgia   . GERD (gastroesophageal reflux disease)   . HLD (hyperlipidemia)   . Hypoglycemia   . Hypothyroidism   . Insomnia   . Prediabetes     Past Surgical History:  Procedure Laterality Date  . APPENDECTOMY    . FACIAL RECONSTRUCTION SURGERY    . LAPAROTOMY    . UTERINE FIBROID SURGERY    . VAGINAL HYSTERECTOMY     complete    Family History  Problem Relation Age of Onset  . Diabetes Mother   . Hypertension Mother   . Heart failure Mother   . Irritable bowel syndrome Mother   . Hypothyroidism Mother   . Atrial fibrillation Mother   . Hyperlipidemia Mother   . Hypertension Father   . Heart disease Father   . Hyperlipidemia Father   . Diabetes Maternal Grandmother   . Hypertension Maternal Grandmother   . Hypertension Maternal Grandfather   . Hypertension Paternal Grandmother   . Hypertension Paternal Grandfather     Social History   Socioeconomic History  . Marital status: Married    Spouse name: Not on file  . Number of children: 0  . Years of education: Not on file  . Highest education level: Not on file  Occupational History  . Occupation: retired  Tobacco Use  . Smoking status: Never Smoker  . Smokeless tobacco: Never Used  Vaping Use  . Vaping Use: Never used  Substance and Sexual Activity  . Alcohol use: Not Currently  . Drug use: Never  . Sexual activity: Not on file  Other Topics Concern  . Not on file  Social History Narrative  . Not on file   Social Determinants of Health   Financial Resource Strain: Not on file  Food Insecurity: Not on file  Transportation Needs: Not on file  Physical Activity: Not on file  Stress: Not on file   Social Connections: Not on file  Intimate Partner Violence: Not on file      Review of systems: All other review of systems negative except as mentioned in the HPI.   Physical Exam: Vitals:   04/24/21 1048  BP: 118/70  Pulse: 84  SpO2: 99%   Body mass index is 19 kg/m. 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  Patient overall appears to be doing better  She continues to have heartburn and and symptoms of acid reflux Schedule for EGD with 48-hour pH Bravo study to differentiate between functional heartburn versus pathologic gastroesophageal acid reflux Hold PPI and H2 blocker given no improvement of symptoms  Return as needed  The risks and benefits as well as alternatives of endoscopic procedure(s) have been discussed and reviewed. All questions answered. The patient agrees to proceed.   This visit required 40 minutes of patient care (this includes precharting, chart review, review of results, face-to-face time used for counseling as well as treatment plan and follow-up. The patient was provided an opportunity to ask questions and all were answered. The patient agreed with the plan and demonstrated an understanding of the instructions.  Iona Beard , MD    CC: Alferd Apa, MD

## 2021-04-24 NOTE — Patient Instructions (Addendum)
It has been recommended to you by your physician that you have a(n) EGD/BRAVO completed. Per your request, we did not schedule the procedure(s) today. Please contact our office at [phone] should you decide to have the procedure completed. You will be scheduled for a pre-visit and procedure at that time. ( Wants an Aug appointment, schedules  are not out yet, patient will call back end of next week)  STOP Pepcid  Due to recent changes in healthcare laws, you may see the results of your imaging and laboratory studies on MyChart before your provider has had a chance to review them.  We understand that in some cases there may be results that are confusing or concerning to you. Not all laboratory results come back in the same time frame and the provider may be waiting for multiple results in order to interpret others.  Please give [place] 48 hours in order for your provider to thoroughly review all the results before contacting the office for clarification of your results.   If you are age 52 or older, your body mass index should be between 23-30. Your Body mass index is 19 kg/m. If this is out of the aforementioned range listed, please consider follow up with your Primary Care Provider.  If you are age 77 or younger, your body mass index should be between 19-25. Your Body mass index is 19 kg/m. If this is out of the aformentioned range listed, please consider follow up with your Primary Care Provider.    I appreciate the  opportunity to care for you  Thank You   Marsa Aris , MD

## 2021-05-07 ENCOUNTER — Telehealth: Payer: Self-pay | Admitting: *Deleted

## 2021-05-07 NOTE — Telephone Encounter (Signed)
Left message for patient that she can schedule her appointment for August now.  She needs a EGD with 48 hour PH Bravo done in the [facility] also will need a Previsit set up

## 2021-06-24 NOTE — Telephone Encounter (Signed)
Beth has scheduled patient for 8/3

## 2021-06-30 ENCOUNTER — Telehealth: Payer: Self-pay | Admitting: Gastroenterology

## 2021-06-30 NOTE — Telephone Encounter (Signed)
DOD Patient of Dr Lavon Paganini whom is being seen for heartburn, GERD and is scheduled for BRAVO study. Her PCP drew labs. Patient's amylase is elevated. PCP to her to contact us. Lipase normal. She is very concerned. Would you review and advise? Thank you

## 2021-06-30 NOTE — Telephone Encounter (Signed)
Pt called requesting if another physician could review her amylase levels from her last labs since Dr. Lavon Paganini is on vacation this week. She is also asking for the ICD 10 code for bravo so she can call her insurance to check coverage.

## 2021-07-02 ENCOUNTER — Other Ambulatory Visit: Payer: Self-pay

## 2021-07-02 ENCOUNTER — Emergency Department
Admission: EM | Admit: 2021-07-02 | Discharge: 2021-07-02 | Disposition: A | Discharge status: Home / Self Care | Payer: 59 | Source: Home / Self Care | Attending: Family Medicine | Admitting: Family Medicine

## 2021-07-02 DIAGNOSIS — G8929 Other chronic pain: Secondary | ICD-10-CM

## 2021-07-02 DIAGNOSIS — R748 Abnormal levels of other serum enzymes: Secondary | ICD-10-CM

## 2021-07-02 DIAGNOSIS — M545 Low back pain, unspecified: Secondary | ICD-10-CM

## 2021-07-02 DIAGNOSIS — Z8739 Personal history of other diseases of the musculoskeletal system and connective tissue: Secondary | ICD-10-CM

## 2021-07-02 MED ORDER — KETOROLAC TROMETHAMINE 30 MG/ML IJ SOLN
15.0000 mg | Freq: Once | INTRAMUSCULAR | Status: AC
  Administered 2021-07-02: 15 mg via INTRAMUSCULAR

## 2021-07-02 MED ORDER — METAXALONE 400 MG PO TABS: 400.0000 mg | ORAL_TABLET | Freq: Three times a day (TID) | ORAL | 0 refills | Status: DC

## 2021-07-02 NOTE — ED Notes (Signed)
Pt asking for script to be sent to [facility] since the prescription is too expensive at [facility].

## 2021-07-02 NOTE — ED Triage Notes (Signed)
Pt c/o back pain, worsening in last week. Some numbness in center of back. Pain does radiate down buttocks and RT side of back. Pain 9/10 Cannot take NSAIDs. Heating pad tried with no relief.  Zanaflex prn with little relief.

## 2021-07-02 NOTE — ED Provider Notes (Signed)
[facility]    CSN: [id number] Arrival date & time: 07/02/21  1654      History   Chief Complaint Chief Complaint  Patient presents with   Back Pain    Lower    HPI [name] is a 55 y.o. female.   HPI  Patient has chronic back pain.  She has been seen by [facility].  She has been recommended to have a nerve block.  She states she has scoliosis.  She states she has had worse pain for the last week.  She is trying to move and has multiple boxes.  She states she is being careful about not doing heavy lifting.  The pain is in the right low back.  No nerve pain into the leg.  No numbness or weakness.  No bowel or bladder complaint. She has a lot of medicine intolerance.  States that she prefers not to take any narcotics.  States she cannot take nonsteroidal anti-inflammatory drugs due to her severe GERD.  Is scheduled to have an endoscopy in less than 2 weeks.  Is good about seeing her gastroenterologist.  She does have a family doctor.  Patient states she has to sleep sitting up every night.  States she is unable to sleep well She states she had x-rays in January.  I am unable to find those in my computer chart review. She takes Zanaflex at night.  States she cannot take it during the day.  She is of small stature, weighing under 90 pounds.  Past Medical History:  Diagnosis Date   Fibromyalgia    GERD (gastroesophageal reflux disease)    HLD (hyperlipidemia)    Hypoglycemia    Hypothyroidism    Insomnia    Prediabetes     There are no problems to display for this patient.   Past Surgical History:  Procedure Laterality Date   APPENDECTOMY     FACIAL RECONSTRUCTION SURGERY     LAPAROTOMY     UTERINE FIBROID SURGERY     VAGINAL HYSTERECTOMY     complete    OB History   No obstetric history on file.      Home Medications    Prior to Admission medications   Medication Sig Start Date End Date Taking? Authorizing Provider  atenolol (TENORMIN) 25  MG tablet Take 12.5 mg by mouth daily.    [provider]  Calcium Carb-Cholecalciferol (CALCIUM 1000 + D) 1000-800 MG-UNIT TABS Take 1 tablet by mouth as needed.    [provider]  cholecalciferol (VITAMIN D3) 25 MCG (1000 UNIT) tablet Take 1 tablet by mouth. Three times a week as needed    [provider]  cyanocobalamin (,VITAMIN B-12,) 1000 MCG/ML injection Inject 1,000 mcg into the muscle. twice a month 09/29/20   [provider]  estradiol (ESTRACE) 1 MG tablet Take 0.5 mg by mouth daily.    [provider]  famotidine (PEPCID) 40 MG tablet Take by mouth. 04/22/20   [provider]  levothyroxine (SYNTHROID) 50 MCG tablet Take 50 mcg by mouth daily before breakfast.    [provider]  metaxalone (SKELAXIN) 400 MG tablet Take 1 tablet (400 mg total) by mouth 3 (three) times daily. As needed muscle spasm 07/02/21   Eustace Moore, MD  pantoprazole (PROTONIX) 20 MG tablet Take 1 tablet by mouth 2 (two) times daily as needed. 04/01/20   [provider]  pioglitazone (ACTOS) 15 MG tablet Take 1 tablet by mouth daily.  12/01/20   [provider]  pravastatin (PRAVACHOL) 20 MG tablet Take 20 mg by mouth daily. 09/07/20   [provider]  sucralfate (CARAFATE) 1 GM/10ML suspension Take 10 mLs (1 g total) by mouth 4 (four) times daily for 21 days. Patient taking differently: Take 1 g by mouth as needed. 05/06/20 05/27/20  Lattie Haw, MD  zolpidem (AMBIEN) 10 MG tablet Take 10 mg by mouth at bedtime as needed for sleep.    [provider]    Family History Family History  Problem Relation Age of Onset   Diabetes Mother    Hypertension Mother    Heart failure Mother    Irritable bowel syndrome Mother    Hypothyroidism Mother    Atrial fibrillation Mother    Hyperlipidemia Mother    Hypertension Father    Heart disease Father    Hyperlipidemia Father    Diabetes Maternal Grandmother     Hypertension Maternal Grandmother    Hypertension Maternal Grandfather    Hypertension Paternal Grandmother    Hypertension Paternal Grandfather     Social History Social History   Tobacco Use   Smoking status: Never   Smokeless tobacco: Never  Vaping Use   Vaping Use: Never used  Substance Use Topics   Alcohol use: Not Currently   Drug use: Never     Allergies   Gadolinium derivatives, Lexapro [escitalopram], Other, Soma [carisoprodol], and Tramadol   Review of Systems Review of Systems See HPI  Physical Exam Triage Vital Signs ED Triage Vitals  Enc Vitals Group     BP 07/02/21 1705 138/86     Pulse Rate 07/02/21 1705 85     Resp 07/02/21 1705 18     Temp 07/02/21 1705 98.1 F (36.7 C)     Temp Source 07/02/21 1705 Oral     SpO2 07/02/21 1705 100 %     Weight --      Height --      Head Circumference --      Peak Flow --      Pain Score 07/02/21 1706 9     Pain Loc --      Pain Edu? --      Excl. in GC? --    No data found.  Updated Vital Signs BP 138/86 (BP Location: Right Arm)   Pulse 85   Temp 98.1 F (36.7 C) (Oral)   Resp 18   SpO2 100%     Physical Exam Constitutional:      General: She is not in acute distress.    Appearance: She is well-developed.     Comments: Patient is lean.  Normal gait and movements  HENT:     Head: Normocephalic and atraumatic.     Mouth/Throat:     Comments: Mask is in place Eyes:     Conjunctiva/sclera: Conjunctivae normal.     Pupils: Pupils are equal, round, and reactive to light.  Cardiovascular:     Rate and Rhythm: Normal rate.  Pulmonary:     Effort: Pulmonary effort is normal. No respiratory distress.  Abdominal:     Palpations: Abdomen is soft.  Musculoskeletal:        General: Normal range of motion.     Cervical back: Normal range of motion.     Comments: Tenderness and increased muscle tone is palpable in the right lumbar column of muscles.  No bony tenderness.  No obvious scoliosis to external  examination.  Strength sensation range of  motion reflexes normal lower extremities with negative straight leg raise  Skin:    General: Skin is warm and dry.  Neurological:     Mental Status: She is alert.     UC Treatments / Results  Labs (all labs ordered are listed, but only abnormal results are displayed) Labs Reviewed - No data to display  EKG   Radiology No results found.  Procedures Procedures (including critical care time)  Medications Ordered in UC Medications  ketorolac (TORADOL) 30 MG/ML injection 15 mg (15 mg Intramuscular Given 07/02/21 1743)    Initial Impression / Assessment and Plan / UC Course  I have reviewed the triage vital signs and the nursing notes.  Pertinent labs & imaging results that were available during my care of the patient were reviewed by me and considered in my medical decision making (see chart for details).     Patient is to receive a shot of Toradol.  She would like to try different muscle relaxer and since she is intolerant of sedation we will try Skelaxin.  She should follow-up with her primary care physician for referral to physical therapy.  Final Clinical Impressions(s) / UC Diagnoses   Final diagnoses:  Chronic right-sided low back pain without sciatica  History of scoliosis     Discharge Instructions      I have provided a shot of Toradol for pain relief.  I hope this gives you some comfort Consider trying Zanaflex during the day, I assume the sedation will improve with time.  I also have prescribed Skelaxin to take for a trial..  You will take this or Zanaflex as muscle relaxer.  Skelaxin (metaxalone) as advertised as a lower sedation muscle relaxer Take Tylenol for moderate pain Use ice or heat to painful back Moderate activity Follow-up with your primary care doctor if you need referral to physical therapy or specialty   ED Prescriptions     Medication Sig Dispense Auth. Provider   metaxalone (SKELAXIN) 400 MG  tablet Take 1 tablet (400 mg total) by mouth 3 (three) times daily. As needed muscle spasm 45 tablet Eustace Moore, MD      PDMP not reviewed this encounter.   Eustace Moore, MD 07/02/21 [phone]

## 2021-07-02 NOTE — Telephone Encounter (Signed)
DOD  Isolated amylase elevation Was present previously as well as evident from labs 2011 No mention of abdominal pain in the notes  Like macroamylasemia  Plan: -Please find out if she has any abdominal pain.  If yes, proceed with CT Abdo/pelvis -If no abdominal pain, check urine amylase and urine creatinine. At same time check BMP, Serum amylase to calculate amylase to creatinine clearance ratio (ACCR).  RG

## 2021-07-02 NOTE — Discharge Instructions (Addendum)
I have provided a shot of Toradol for pain relief.  I hope this gives you some comfort Consider trying Zanaflex during the day, I assume the sedation will improve with time.  I also have prescribed Skelaxin to take for a trial..  You will take this or Zanaflex as muscle relaxer.  Skelaxin (metaxalone) as advertised as a lower sedation muscle relaxer Take Tylenol for moderate pain Use ice or heat to painful back Moderate activity Follow-up with your primary care doctor if you need referral to physical therapy or specialty

## 2021-07-02 NOTE — Telephone Encounter (Signed)
Dr Nandigam FYI 

## 2021-07-02 NOTE — Telephone Encounter (Signed)
Spoke with the patient. She tells me her "stomach hurts sometimes, but not always." Explained the plan of investigation. She agrees to the lab work. She declines CT at this time.

## 2021-07-02 NOTE — ED Notes (Signed)
Pharmacy changed per pt. First pharmacy notified of pharmacy change to cancel rx fill.

## 2021-07-03 ENCOUNTER — Other Ambulatory Visit (INDEPENDENT_AMBULATORY_CARE_PROVIDER_SITE_OTHER): Payer: 59

## 2021-07-03 DIAGNOSIS — R748 Abnormal levels of other serum enzymes: Secondary | ICD-10-CM | POA: Diagnosis not present

## 2021-07-03 LAB — BASIC METABOLIC PANEL
BUN: 11 mg/dL (ref 6–23)
CO2: 24 mEq/L (ref 19–32)
Calcium: 8.7 mg/dL (ref 8.4–10.5)
Chloride: 112 mEq/L (ref 96–112)
Creatinine, Ser: 0.58 mg/dL (ref 0.40–1.20)
GFR: 102.54 mL/min (ref >60.00)
Glucose, Bld: 103 mg/dL — ABNORMAL HIGH (ref 70–99)
Potassium: 3.8 mEq/L (ref 3.5–5.1)
Sodium: 142 mEq/L (ref 135–145)

## 2021-07-03 LAB — AMYLASE: Amylase: 61 U/L (ref 27–131)

## 2021-07-04 ENCOUNTER — Telehealth: Payer: Self-pay | Admitting: Emergency Medicine

## 2021-07-04 LAB — AMYLASE, RANDOM URINE: Amylase, Ur: 418 U/L

## 2021-07-04 LAB — CREATININE, URINE, RANDOM: Creatinine, Urine: 230.4 mg/dL

## 2021-07-04 NOTE — Telephone Encounter (Signed)
Patient left message on VM that she cannot afford rx for Skelaxin; requesting alternative. Wants suggestions for analgesic and is having endoscopy next week and cannot take NSAID. Spoke with M.Ragan who asked me to call in rx for Baclofen 10 mg po tid x 10 days for total #30; no refills and to tell patient to take acetaminophen as suggested in AVS and use hot/cold. Left message on patient's VM. Called to [facility] on [address]

## 2021-07-16 ENCOUNTER — Ambulatory Visit (INDEPENDENT_AMBULATORY_CARE_PROVIDER_SITE_OTHER): Payer: 59 | Admitting: Otolaryngology

## 2021-07-16 ENCOUNTER — Other Ambulatory Visit: Payer: Self-pay

## 2021-07-16 DIAGNOSIS — K219 Gastro-esophageal reflux disease without esophagitis: Secondary | ICD-10-CM

## 2021-07-16 DIAGNOSIS — J310 Chronic rhinitis: Secondary | ICD-10-CM | POA: Diagnosis not present

## 2021-07-16 MED ORDER — TRIAMCINOLONE ACETONIDE 55 MCG/ACT NA AERO: 2.0000 | INHALATION_SPRAY | Freq: Every day | NASAL | 12 refills | Status: DC

## 2021-07-16 NOTE — Progress Notes (Addendum)
HPI: [name] is a 55 y.o. female who presents is referred by her PCP for evaluation of multiple complaints as well as recent finding on MRI scan of a T2 hyperintense lesion in the right tonsil pharyngeal region. Patient states that she has had a chronic history of reflux problems and is seen GI and has had previous endoscopy and takes antireflux medication twice daily.  She also complains of congestion in her nose as well as her throat.  She has tried antihistamines without much benefit.  She is presently not on any nasal sprays.  She also complains of bubbling mucus in the right side of the throat as well as occasional discomfort on the right side of her throat.  However she does not have any trouble swallowing.  And does not describe pain or discomfort when drinking liquids.  She also complains of pressure in her ears..  Past Medical History:  Diagnosis Date   Fibromyalgia    GERD (gastroesophageal reflux disease)    HLD (hyperlipidemia)    Hypoglycemia    Hypothyroidism    Insomnia    Prediabetes    Past Surgical History:  Procedure Laterality Date   APPENDECTOMY     FACIAL RECONSTRUCTION SURGERY     LAPAROTOMY     UTERINE FIBROID SURGERY     VAGINAL HYSTERECTOMY     complete   Social History   Socioeconomic History   Marital status: Married    Spouse name: Not on file   Number of children: 0   Years of education: Not on file   Highest education level: Not on file  Occupational History   Occupation: retired  Tobacco Use   Smoking status: Never   Smokeless tobacco: Never  Vaping Use   Vaping Use: Never used  Substance and Sexual Activity   Alcohol use: Not Currently   Drug use: Never   Sexual activity: Not on file  Other Topics Concern   Not on file  Social History Narrative   Not on file   Social Determinants of Health   Financial Resource Strain: Not on file  Food Insecurity: Not on file  Transportation Needs: Not on file  Physical Activity: Not on file   Stress: Not on file  Social Connections: Not on file   Family History  Problem Relation Age of Onset   Diabetes Mother    Hypertension Mother    Heart failure Mother    Irritable bowel syndrome Mother    Hypothyroidism Mother    Atrial fibrillation Mother    Hyperlipidemia Mother    Hypertension Father    Heart disease Father    Hyperlipidemia Father    Diabetes Maternal Grandmother    Hypertension Maternal Grandmother    Hypertension Maternal Grandfather    Hypertension Paternal Grandmother    Hypertension Paternal Grandfather    Allergies  Allergen Reactions   Gadolinium Derivatives Hives    Pre dose prior to contrast scans per Rad   Lexapro [Escitalopram] Itching   Other     Patient does not want any narcotics- makes her nauseous   Soma [Carisoprodol] Itching   Tramadol Itching    Ultram   Prior to Admission medications   Medication Sig Start Date End Date Taking? Authorizing Provider  atenolol (TENORMIN) 25 MG tablet Take 12.5 mg by mouth daily.    [provider]  Calcium Carb-Cholecalciferol (CALCIUM 1000 + D) 1000-800 MG-UNIT TABS Take 1 tablet by mouth as needed.    [provider]  cholecalciferol (VITAMIN D3) 25 MCG (1000 UNIT) tablet Take 1 tablet by mouth. Three times a week as needed    [provider]  cyanocobalamin (,VITAMIN B-12,) 1000 MCG/ML injection Inject 1,000 mcg into the muscle. twice a month 09/29/20   [provider]  estradiol (ESTRACE) 1 MG tablet Take 0.5 mg by mouth daily.    [provider]  famotidine (PEPCID) 40 MG tablet Take by mouth. 04/22/20   [provider]  levothyroxine (SYNTHROID) 50 MCG tablet Take 50 mcg by mouth daily before breakfast.    [provider]  metaxalone (SKELAXIN) 400 MG tablet Take 1 tablet (400 mg total) by mouth 3 (three) times daily. As needed muscle spasm 07/02/21   Eustace Moore, MD  pantoprazole (PROTONIX) 20 MG tablet Take 1 tablet by mouth 2  (two) times daily as needed. 04/01/20   [provider]  pioglitazone (ACTOS) 15 MG tablet Take 1 tablet by mouth daily. 12/01/20   [provider]  pravastatin (PRAVACHOL) 20 MG tablet Take 20 mg by mouth daily. 09/07/20   [provider]  sucralfate (CARAFATE) 1 GM/10ML suspension Take 10 mLs (1 g total) by mouth 4 (four) times daily for 21 days. Patient taking differently: Take 1 g by mouth as needed. 05/06/20 05/27/20  Lattie Haw, MD  zolpidem (AMBIEN) 10 MG tablet Take 10 mg by mouth at bedtime as needed for sleep.    [provider]     Positive ROS: Otherwise negative  All other systems have been reviewed and were otherwise negative with the exception of those mentioned in the HPI and as above.  Physical Exam: Constitutional: Alert, well-appearing, no acute distress Ears: External ears without lesions or tenderness. Ear canals are clear bilaterally.  Both TMs are clear with no middle ear effusion noted. Nasal: External nose without lesions. Septum minimally deviated to the left with mild rhinitis.  After decongesting the nose both middle meatus regions were clear.  No signs of infection no polyps noted intranasally.  On review of her MRI scan of her head this showed clear paranasal sinuses.  With a single left maxillary sinus cyst that is nonobstructing. Oral: Lips and gums without lesions. Tongue and palate mucosa without lesions. Posterior oropharynx clear.  Is difficult to evaluate patient's tonsils on exam in the office as she has a small mouth with elevation of the base of tongue.  Even with using tongue blades could only visualize the superior aspect of the tonsils..  However the tongue buccal mucosa and floor mouth wall clear to examination.  No mucosal abnormalities noted.  Indirect laryngoscopy revealed limited visualization of the base of tongue which was clear. Fiberoptic laryngoscopy was performed to the right nostril.  The nasal cavity was  clear the right side nasopharynx was clear.  The base of tongue was clear with no abnormalities noted.  Vallecula and epiglottis were normal.  Vocal cords were clear with normal vocal mobility bilaterally.  Piriform sinuses were clear. Palpation of the base of tongue and tonsil regions was benign with no induration and no palpable masses. Neck: No palpable adenopathy or masses.  No palpable adenopathy in the neck. Respiratory: Breathing comfortably  Skin: No facial/neck lesions or rash noted.  Laryngoscopy  Date/Time: 07/16/2021 1:54 PM Performed by: Drema Halon, MD Authorized by: Drema Halon, MD   Consent:    Consent obtained:  Verbal   Consent given by:  Patient Procedure details:    Indications: direct visualization of  the upper aerodigestive tract     Medication:  Afrin   Instrument: flexible fiberoptic laryngoscope   Sinus:    Right nasopharynx: normal     Right Eustachian tube orifices: normal   Mouth:    Oropharynx: normal     Vallecula: normal     Base of tongue: normal     Epiglottis: normal   Throat:    True vocal cords: normal   Comments:     On fiberoptic laryngoscopy the nasopharynx was clear.  The base of tongue vallecula and epiglottis were normal.  Vocal cords were clear bilaterally with normal vocal mobility.  Assessment: I suspect GE reflux disease is contributing some to her symptoms. She has clear upper airway examination on fiberoptic laryngoscopy as well as direct visualization and palpation. History of rhinitis.  Plan: For her nasal congestion and ear congestion recommended Nasacort 2 sprays each nostril at night as well as use of saline rinses during the daytime. Reassured her of no evidence of neoplasia or infection on clinical exam today Concerning her reflux disease would recommend gastroenterology evaluation and further treatment as needed. Called Nasacort into her pharmacy.   Narda Bonds, MD   CC:

## 2021-07-22 ENCOUNTER — Encounter: Payer: 59 | Admitting: Gastroenterology

## 2021-08-31 ENCOUNTER — Encounter: Payer: 59 | Admitting: Gastroenterology

## 2021-09-22 ENCOUNTER — Encounter: Payer: Self-pay | Admitting: Gastroenterology

## 2021-09-22 ENCOUNTER — Ambulatory Visit: Payer: 59 | Admitting: Gastroenterology

## 2021-09-22 VITALS — BP 120/76 | HR 76 | Ht <= 58 in | Wt 84.0 lb

## 2021-09-22 DIAGNOSIS — K219 Gastro-esophageal reflux disease without esophagitis: Secondary | ICD-10-CM | POA: Diagnosis not present

## 2021-09-22 DIAGNOSIS — R12 Heartburn: Secondary | ICD-10-CM

## 2021-09-22 DIAGNOSIS — K588 Other irritable bowel syndrome: Secondary | ICD-10-CM

## 2021-09-22 NOTE — Progress Notes (Signed)
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 She completed almost 4 weeks course of Xifaxan, She also started using "Prawal panchamrit rasa, ayurvedic medicine".  She feels the herbal medicine is helping with gas and most of her symptoms. She is able to eat better, is tolerating different variety of foods.  No longer losing weight.    She continues to have significant heartburn, especially worse when she lays down, she feels acid come up on in the right side of her throat, her gum and teeth hurt and her sputum turns white.  She also feels burning sensation in both her thighs, calves and arms when she starts having acid come up her throat. Pantoprazole and Pepcid makes her symptoms of acid reflux worse   She was recommended to undergo EGD along with 48-hour pH Bravo study, patient canceled the procedure that was scheduled during last visit.  She is interested in fundoplication, is reluctant to undergo pH testing at this point.   GI Hx: Breath hydrogen test: positve   Hydrogen increase over baseline 37  (Normal <20)   Methane peak 11 (<10)    She has had multiple imaging including MRI enterography, CT abdomen pelvis done by her previous GI in [place]   She had colonoscopy with biopsies at [facility] in May 2021, was normal . Full report not available in epic    Outpatient Encounter Medications as of 09/22/2021  Medication Sig   Calcium Carb-Cholecalciferol (CALCIUM 1000 + D) 1000-800 MG-UNIT TABS Take 1 tablet by mouth as needed.   cholecalciferol (VITAMIN  D3) 25 MCG (1000 UNIT) tablet Take 1 tablet by mouth. Three times a week as needed   cyanocobalamin (,VITAMIN B-12,) 1000 MCG/ML injection Inject 1,000 mcg into the muscle. twice a month   estradiol (ESTRACE) 1 MG tablet Take 0.5 mg by mouth daily.   famotidine (PEPCID) 40 MG tablet Take by mouth.   gabapentin (NEURONTIN) 300 MG capsule Take 300 mg by mouth at bedtime.   levothyroxine (SYNTHROID) 50 MCG tablet Take 50 mcg by mouth daily before breakfast.   tiZANidine (ZANAFLEX) 4 MG tablet Take 1 tablet by mouth every 8 (eight) hours as needed.   zolpidem (AMBIEN) 10 MG tablet Take 10 mg by mouth at bedtime as needed for sleep.   pravastatin (PRAVACHOL) 20 MG tablet Take 20 mg by mouth daily. (Patient not taking: Reported on 09/22/2021)   [DISCONTINUED] atenolol (TENORMIN) 25 MG tablet Take 12.5 mg by mouth daily.   [DISCONTINUED] metaxalone (SKELAXIN) 400 MG tablet Take 1 tablet (400 mg total) by mouth 3 (three) times daily. As needed muscle spasm   [DISCONTINUED] pantoprazole (PROTONIX) 20 MG tablet Take 1 tablet by mouth 2 (two) times daily as needed.   [DISCONTINUED] pioglitazone (ACTOS) 15 MG tablet Take 1 tablet by mouth daily.   [DISCONTINUED] sucralfate (CARAFATE) 1 GM/10ML suspension Take 10 mLs (1 g total) by mouth 4 (four) times daily for 21 days. (Patient taking differently: Take 1 g by mouth as needed.)   [DISCONTINUED] triamcinolone (NASACORT) 55 MCG/ACT AERO nasal inhaler Place  2 sprays into the nose daily. 2 sprays each nostril at night   No facility-administered encounter medications on file as of 09/22/2021.    Allergies as of 09/22/2021 - Review Complete 09/22/2021  Allergen Reaction Noted   Gadolinium derivatives Hives 02/13/2021   Lexapro [escitalopram] Itching 12/24/2020   Other  05/06/2020   Soma [carisoprodol] Itching 12/24/2020   Tramadol Itching 12/24/2020    Past Medical History:  Diagnosis Date   Fibromyalgia    GERD (gastroesophageal reflux disease)    HLD  (hyperlipidemia)    Hypoglycemia    Hypothyroidism    Insomnia    Prediabetes     Past Surgical History:  Procedure Laterality Date   APPENDECTOMY     FACIAL RECONSTRUCTION SURGERY     LAPAROTOMY     UTERINE FIBROID SURGERY     VAGINAL HYSTERECTOMY     complete    Family History  Problem Relation Age of Onset   Diabetes Mother    Hypertension Mother    Heart failure Mother    Irritable bowel syndrome Mother    Hypothyroidism Mother    Atrial fibrillation Mother    Hyperlipidemia Mother    Hypertension Father    Heart disease Father    Hyperlipidemia Father    Diabetes Maternal Grandmother    Hypertension Maternal Grandmother    Hypertension Maternal Grandfather    Hypertension Paternal Grandmother    Hypertension Paternal Grandfather     Social History   Socioeconomic History   Marital status: Married    Spouse name: Not on file   Number of children: 0   Years of education: Not on file   Highest education level: Not on file  Occupational History   Occupation: retired  Tobacco Use   Smoking status: Never   Smokeless tobacco: Never  Vaping Use   Vaping Use: Never used  Substance and Sexual Activity   Alcohol use: Not Currently   Drug use: Never   Sexual activity: Not on file  Other Topics Concern   Not on file  Social History Narrative   Not on file   Social Determinants of Health   Financial Resource Strain: Not on file  Food Insecurity: Not on file  Transportation Needs: Not on file  Physical Activity: Not on file  Stress: Not on file  Social Connections: Not on file  Intimate Partner Violence: Not on file      Review of systems: All other review of systems negative except as mentioned in the HPI.   Physical Exam: Vitals:   09/22/21 1113  BP: 120/76  Pulse: 76   Body mass index is 18.18 kg/m. 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  Patient overall appears to be doing better, no longer has abdominal pain, discomfort or excess gas   She continues to have heartburn and symptoms of acid reflux/LPR  She was scheduled EGD with 48-hour pH Bravo study to differentiate between functional heartburn versus pathologic gastroesophageal acid reflux, she canceled the procedure She is interested in pursuing antireflux surgery. Will refer to [facility] gastroenterology for further evaluation and management of GERD and nocturnal LPR symptoms   Return as needed  This visit required 40 minutes of patient care (this includes precharting, chart review, review of results, face-to-face time used for counseling as well as treatment plan and follow-up. The patient was provided an opportunity to ask questions and all were answered. The patient agreed with the plan and demonstrated an understanding of the instructions.  Iona Beard , MD    CC: Alferd Apa, MD

## 2021-09-22 NOTE — Patient Instructions (Signed)
We will send a Referral to [facility], and they should contact you with an appointment  If you are age 54 or older, your body mass index should be between 23-30. Your Body mass index is 18.18 kg/m. If this is out of the aforementioned range listed, please consider follow up with your Primary Care Provider.  If you are age 31 or younger, your body mass index should be between 19-25. Your Body mass index is 18.18 kg/m. If this is out of the aformentioned range listed, please consider follow up with your Primary Care Provider.   __________________________________________________________  The [facility] providers would like to encourage you to use [facility] to communicate with providers for non-urgent requests or questions.  Due to long hold times on the telephone, sending your provider a message by [facility] may be a faster and more efficient way to get a response.  Please allow 48 business hours for a response.  Please remember that this is for non-urgent requests.    I appreciate the  opportunity to care for you  Thank You   Marsa Aris , MD

## 2021-09-23 ENCOUNTER — Telehealth: Payer: Self-pay | Admitting: *Deleted

## 2021-09-23 NOTE — Telephone Encounter (Signed)
Faxed referral to [facility] for consult or second opinion as requested by patient today

## 2021-10-13 NOTE — Telephone Encounter (Signed)
Inbound call from patient stating she never received a call from [facility] in regards to the referral.  Please advise.

## 2021-10-13 NOTE — Telephone Encounter (Signed)
Spoke with [facility] and they said they are 100's of referrals behind. They said tell the patient to give them another week if they hadn't called her for her to contact them at [phone]. Patient is leaving for [place] on Saturday she took down the number and will contact them if they do not call

## 2021-10-13 NOTE — Telephone Encounter (Signed)
Spoke with patient, I faxed over referral on 09/23/2021   Not sure if [facility] is behind on referrals. I will call them today

## 2022-02-18 IMAGING — MR MR HEAD W/O CM
12 series · 46 of 48 positions shown · non-contrast
Comparison: No pertinent prior exams available for comparison.

CLINICAL DATA: Multiple neurologic symptoms. Additional history
provided by scanning technologist: Patient reports right
posterolateral skull pain, burning in back of head, twitching in
both legs, symptoms ongoing for 1+ years.

EXAM:
MRI HEAD WITHOUT CONTRAST
TECHNIQUE: Multiplanar, multiecho pulse sequences of the brain and surrounding
structures were obtained without intravenous contrast.

[Series 5: ax dwi_tracew · axial · 3.0mm · 0.60mm/px · z∈[-126,+20]mm · 3 of 48 slices shown]
[im 1/48]
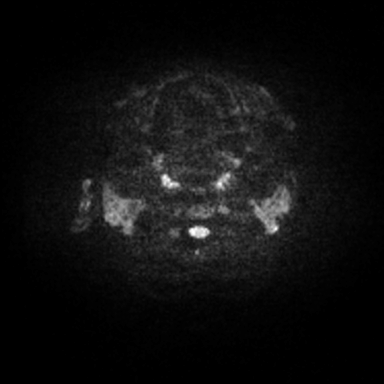
[im 24/48]
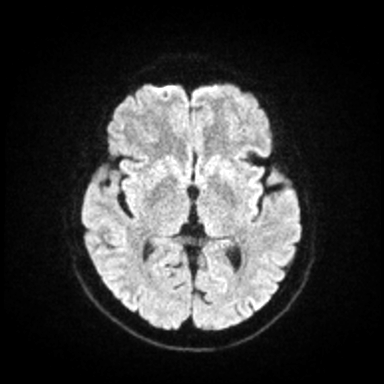
[im 48/48]
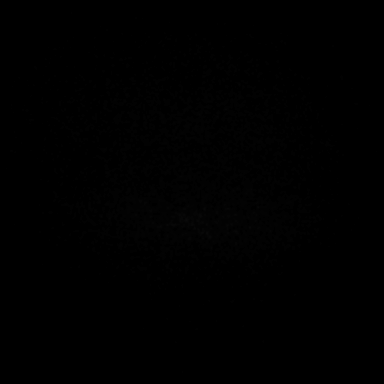

[Series 6: ax dwi_adc · axial · 3.0mm · 0.60mm/px · z∈[-126,+17]mm · 4 of 47 slices shown]
[im 1/47]
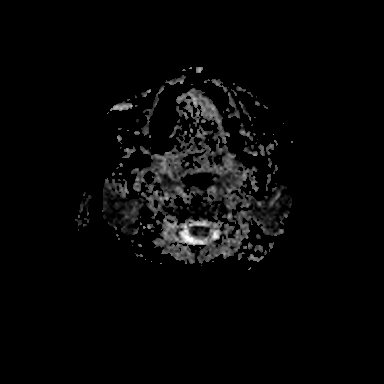
[im 16/47]
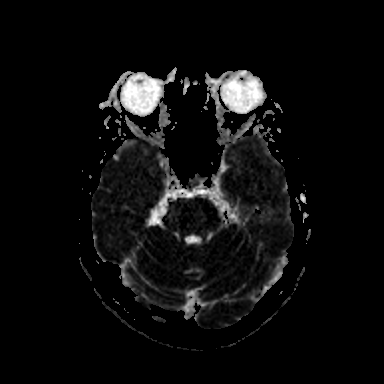
[im 31/47]
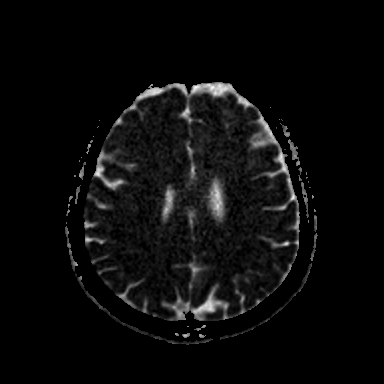
[im 47/47]
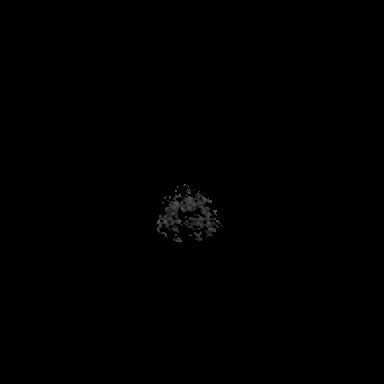

[Series 7: cor dwi_tracew · coronal · 5.0mm · 0.68mm/px · 3 of 36 slices shown]
[im 1/36]
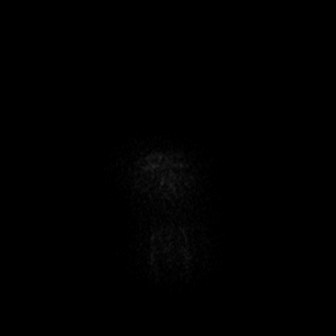
[im 18/36]
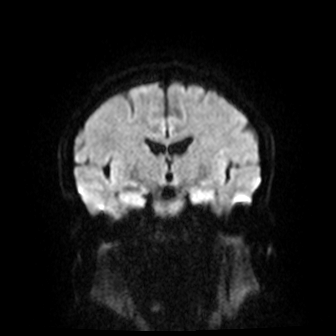
[im 36/36]
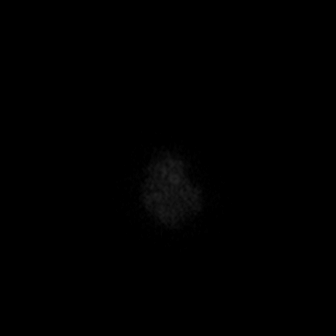

[Series 8: cor dwi_adc · coronal · 5.0mm · 0.68mm/px · 3 of 36 slices shown]
[im 1/36]
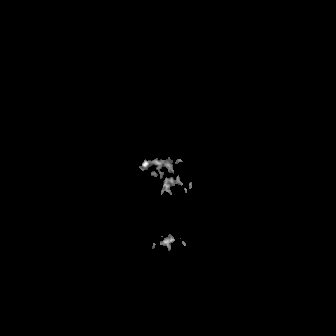
[im 18/36]
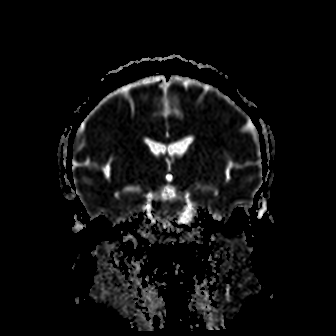
[im 36/36]
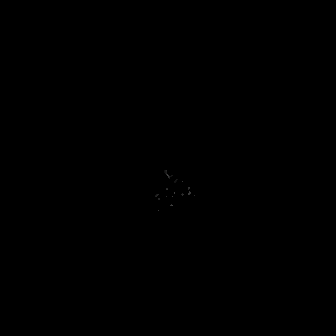

[Series 9: T1 · sagittal · 5.0mm · 0.62mm/px · 2 of 21 slices shown (1 of 2)]
[im 1/21]
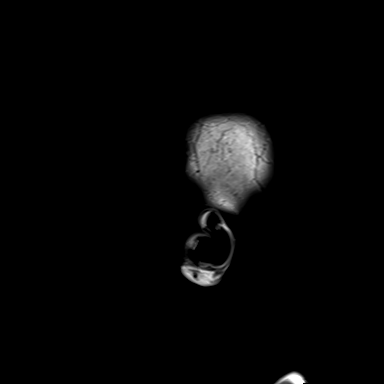
[im 21/21]
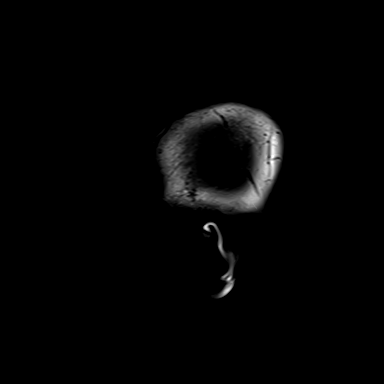

[Series 10: FLAIR · sagittal · 5.0mm · 0.94mm/px · 2 of 21 slices shown (1 of 2)]
[im 1/21]
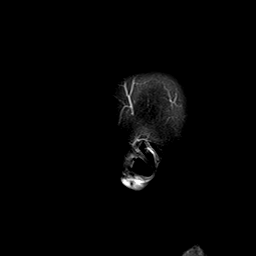
[im 21/21]
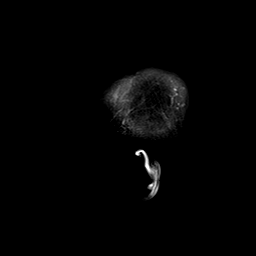

[Series 11: T2 · axial · 5.0mm · 0.53mm/px · z∈[-116,+19]mm · 2 of 25 slices shown (1 of 2)]
[im 1/25]
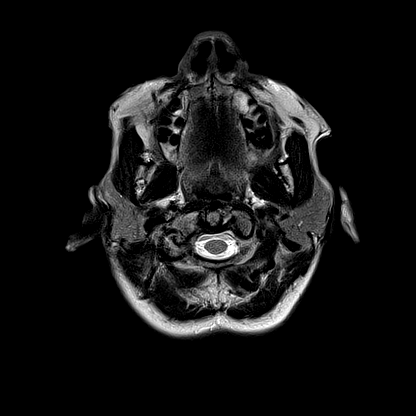
[im 25/25]
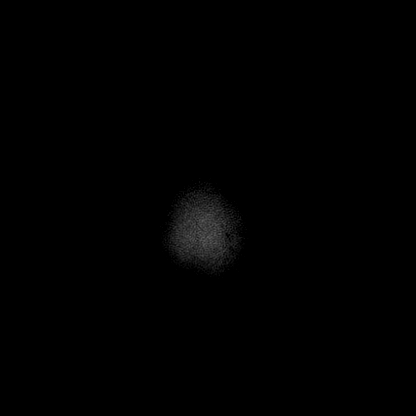

[Series 13: pha_images · axial · 3.0mm · 0.90mm/px · z∈[-134,+15]mm · 4 of 54 slices shown]
[im 1/54]
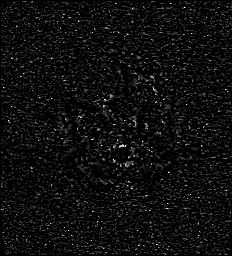
[im 18/54]
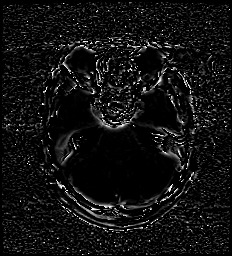
[im 36/54]
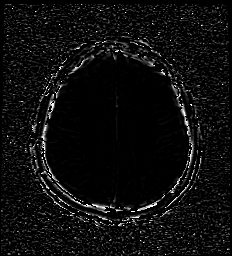
[im 54/54]
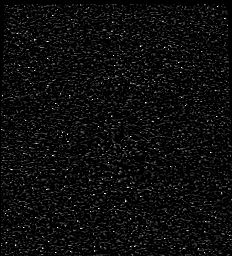

[Series 14: swi_images · axial · 3.0mm · 0.90mm/px · z∈[-134,+32]mm · 5 of 60 slices shown]
[im 1/60]
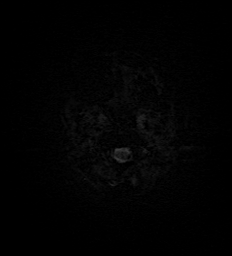
[im 15/60]
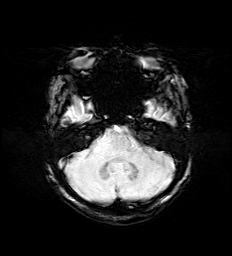
[im 30/60]
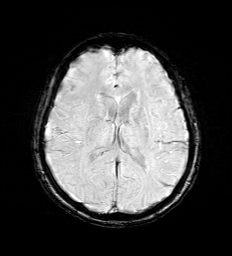
[im 45/60]
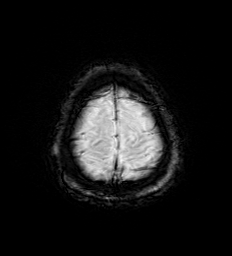
[im 60/60]
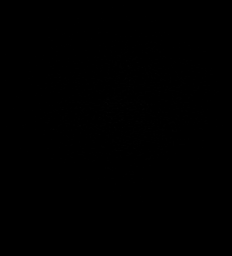

[Series 16: FLAIR · axial · 3.0mm · 0.53mm/px · z∈[-125,+27]mm · 4 of 55 slices shown (2 of 2)]
[im 1/55]
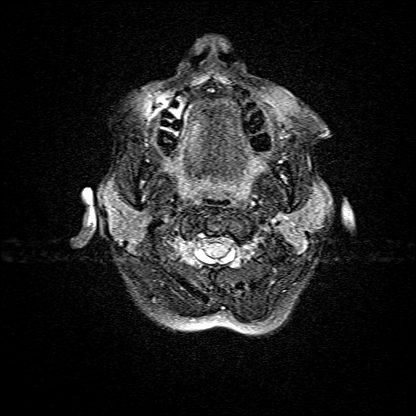
[im 19/55]
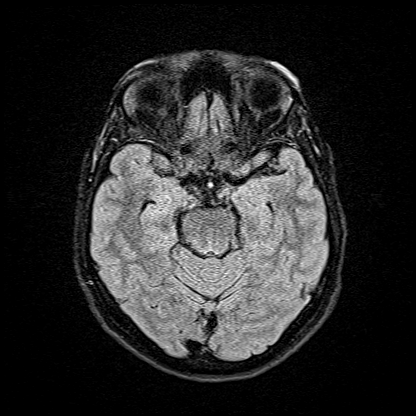
[im 37/55]
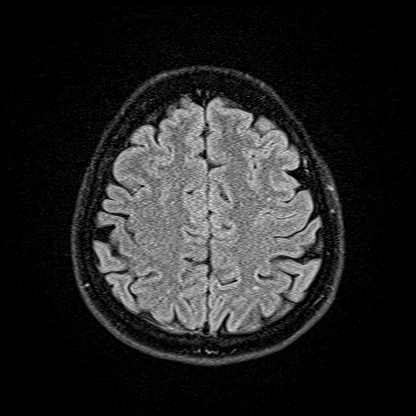
[im 55/55]
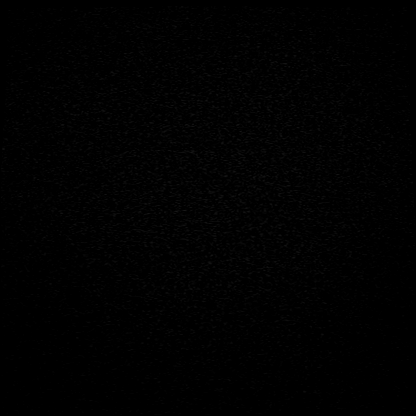

[Series 17: T1 · axial · 1.0mm · 0.98mm/px · z∈[-135,+28]mm · 12 of 171 slices shown (2 of 2)]
[im 1/171]
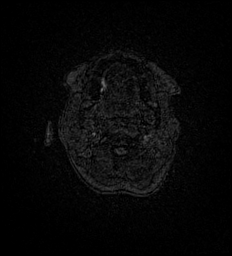
[im 14/171]
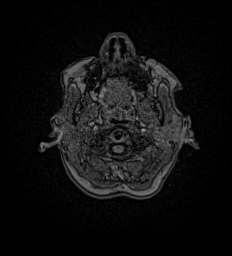
[im 27/171]
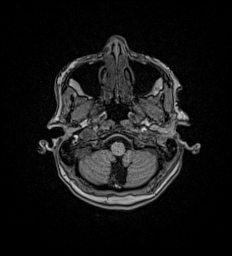
[im 40/171]
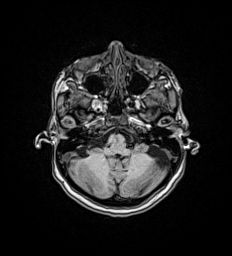
[im 53/171]
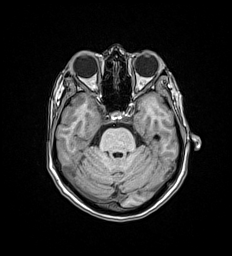
[im 66/171]
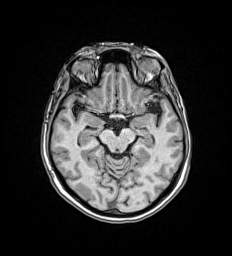
[im 79/171]
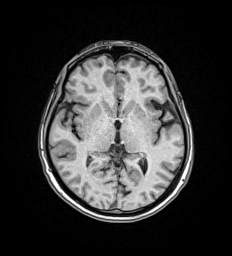
[im 92/171]
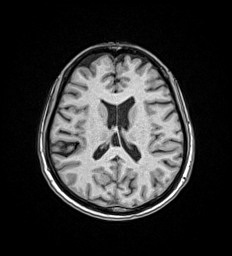
[im 105/171]
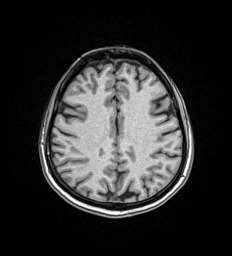
[im 118/171]
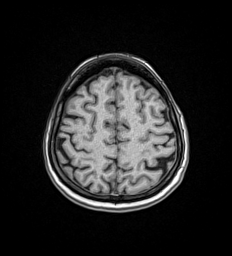
[im 144/171]
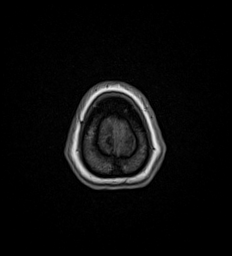
[im 171/171]
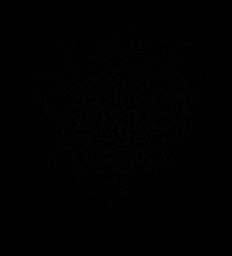

[Series 18: T2 · coronal · 5.0mm · 0.57mm/px · 2 of 27 slices shown (2 of 2)]
[im 1/27]
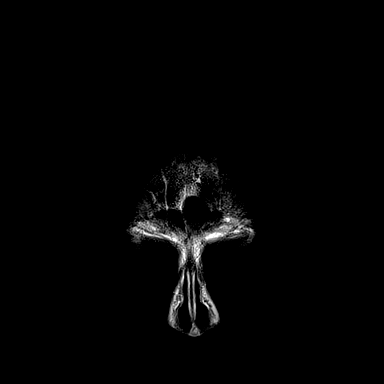
[im 27/27]
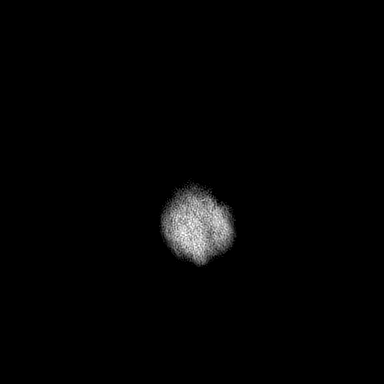

[46 of 48 positions shown; findings below may reference images not displayed]

FINDINGS: Brain:

Mild intermittent motion degradation.

Cerebral volume is normal for age.

Mild multifocal T2/FLAIR hyperintensity within the cerebral white
matter.

There is no acute infarct.

No evidence of intracranial mass.

No chronic intracranial blood products.

No extra-axial fluid collection.

No midline shift.

Vascular: Expected proximal arterial flow voids.

Skull and upper cervical spine: No focal calvarial marrow lesion.
Cervical spine better assessed on concurrently performed cervical
spine MRI.

Sinuses/Orbits: Visualized orbits show no acute finding. 2 cm left
maxillary sinus mucous retention cyst. Trace bilateral ethmoid and
right maxillary sinus mucosal thickening.
IMPRESSION: No evidence of acute intracranial abnormality.

Mild multifocal T2/FLAIR hyperintensity within the cerebral white
matter which is nonspecific, but most often secondary to chronic
small vessel ischemia. Potential alternative considerations would
include sequela of a prior infectious/inflammatory process, migraine
headaches or a demyelinating process, among others.

2 cm left maxillary sinus mucous retention cyst.

## 2022-02-18 IMAGING — MR MR CERVICAL SPINE W/O CM
5 series · 39 of 48 positions shown · non-contrast
Comparison: Concurrently performed brain MRI 02/13/2021.

CLINICAL DATA: Multiple neurologic symptoms. Additional history
provided by scanning technologist: Patient reports right
posterolateral skull pain, burning in back of head, twitching in
both legs, ongoing symptoms for 1+ years.

EXAM:
MRI CERVICAL SPINE WITHOUT CONTRAST
TECHNIQUE: Multiplanar, multisequence MR imaging of the cervical spine was
performed. No intravenous contrast was administered.

[Series 5: T2 · sagittal · 3.0mm · 0.62mm/px · 8 of 15 slices shown (1 of 2)]
[im 1/15]
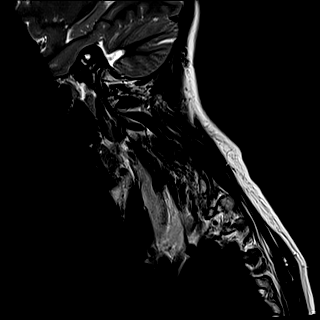
[im 3/15]
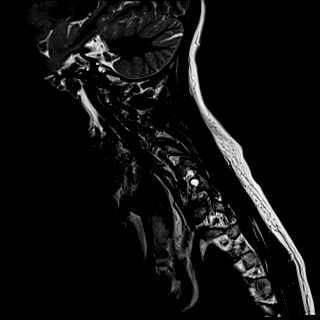
[im 5/15]
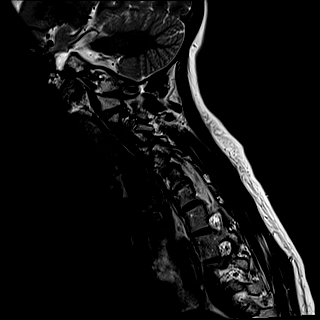
[im 7/15]
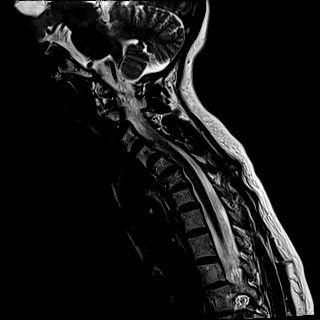
[im 9/15]
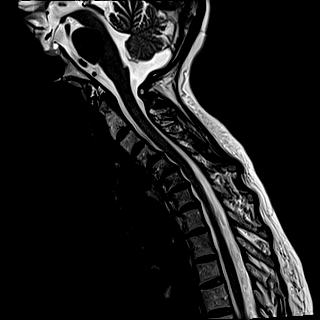
[im 11/15]
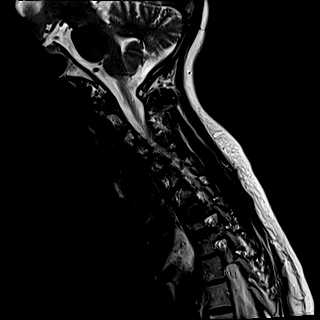
[im 13/15]
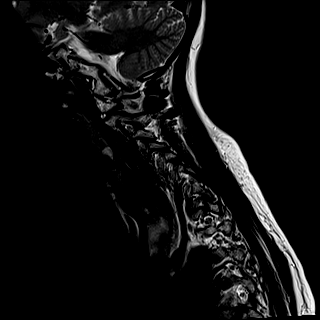
[im 15/15]
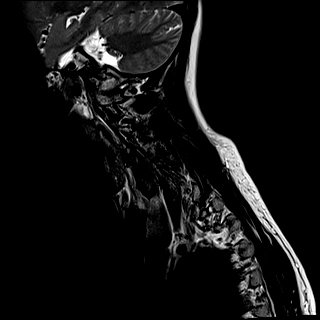

[Series 6: FLAIR · sagittal · 3.0mm · 0.78mm/px · 8 of 15 slices shown]
[im 1/15]
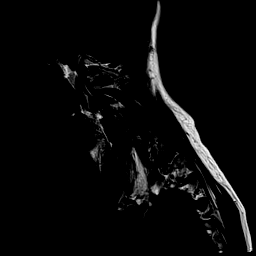
[im 3/15]
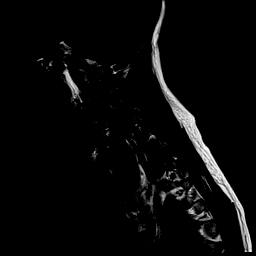
[im 5/15]
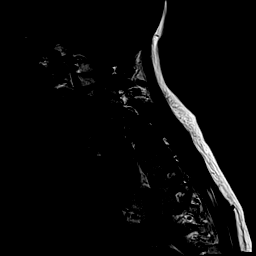
[im 7/15]
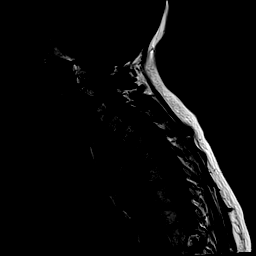
[im 9/15]
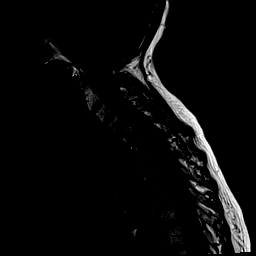
[im 11/15]
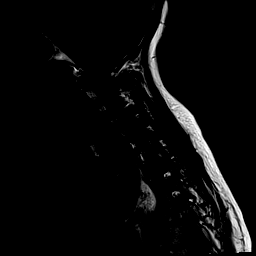
[im 13/15]
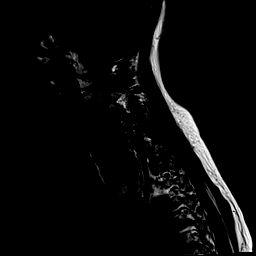
[im 15/15]
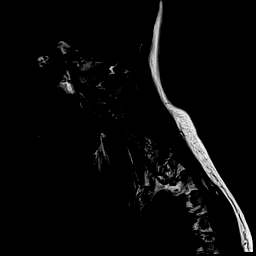

[Series 7: STIR · sagittal · 3.0mm · 0.62mm/px · 8 of 15 slices shown]
[im 1/15]
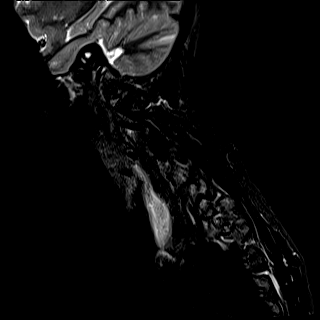
[im 3/15]
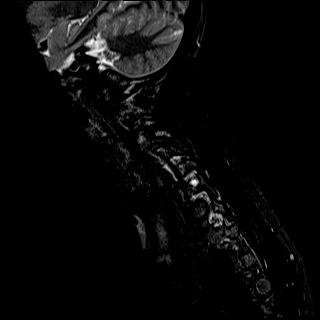
[im 5/15]
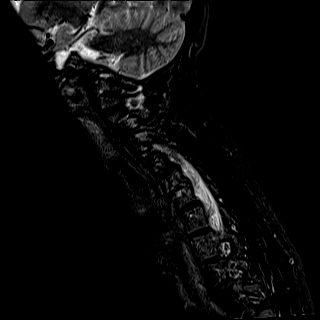
[im 7/15]
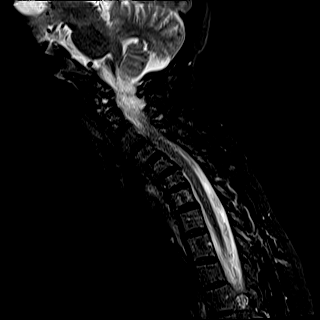
[im 9/15]
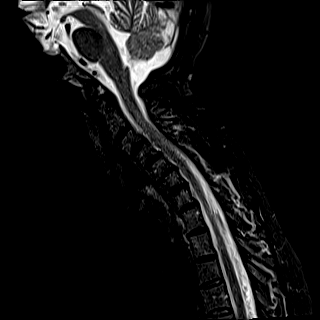
[im 11/15]
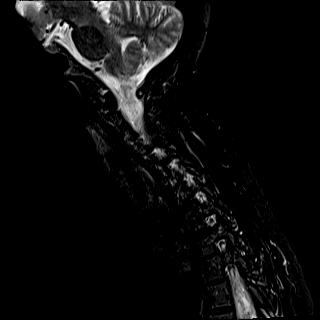
[im 13/15]
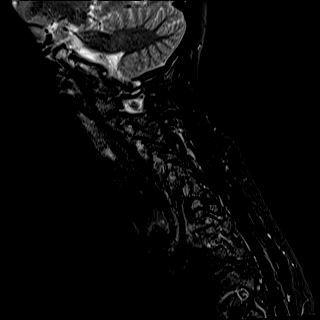
[im 15/15]
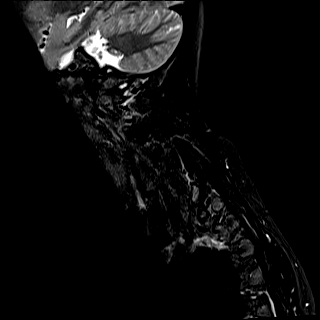

[Series 8: T2 · axial · 3.0mm · 0.70mm/px · z∈[-193,-124]mm · 9 of 23 slices shown (2 of 2)]
[im 1/23]
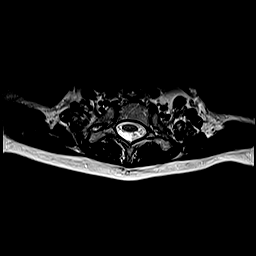
[im 5/23]
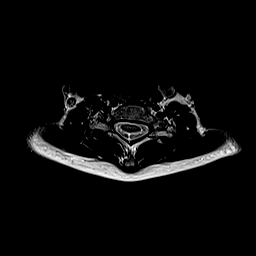
[im 7/23]
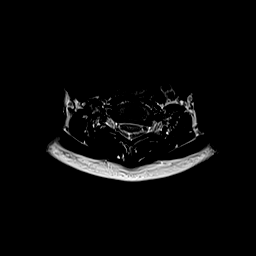
[im 11/23]
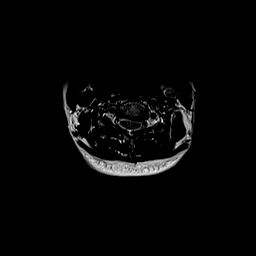
[im 13/23]
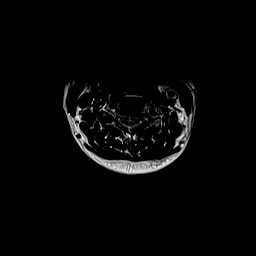
[im 17/23]
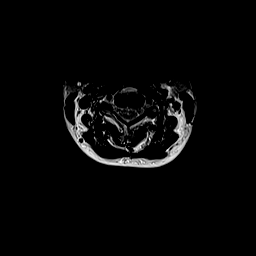
[im 19/23]
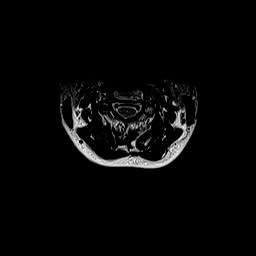
[im 21/23]
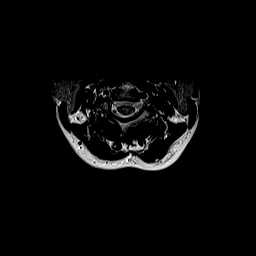
[im 23/23]
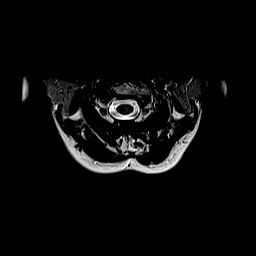

[Series 9: ax mpgr · axial · 3.0mm · 0.35mm/px · z∈[-193,-143]mm · 6 of 23 slices shown]
[im 1/23]
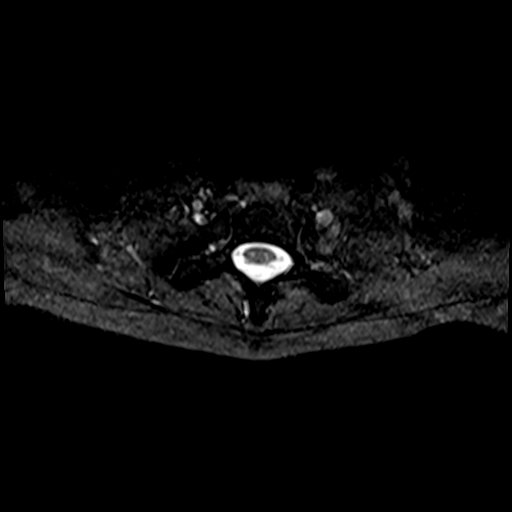
[im 5/23]
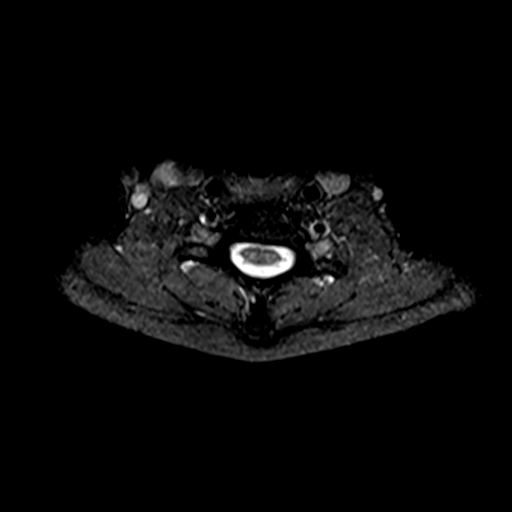
[im 7/23]
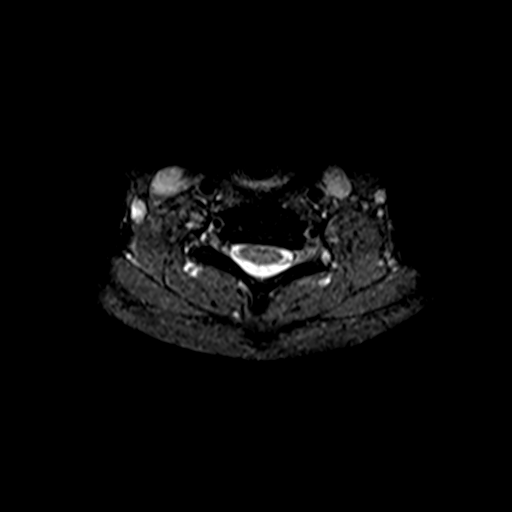
[im 11/23]
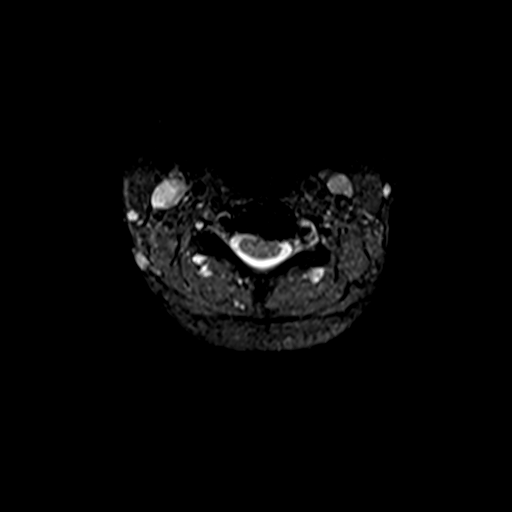
[im 13/23]
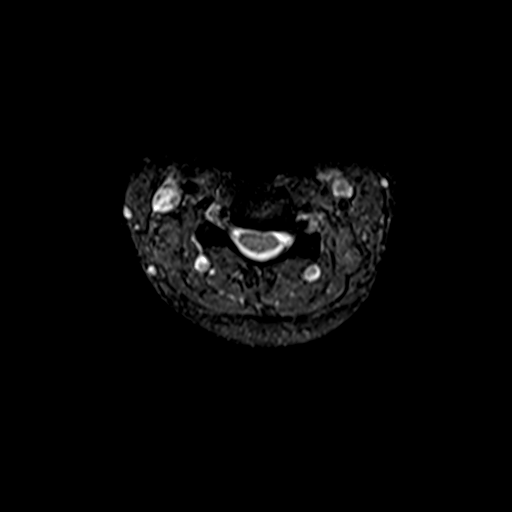
[im 17/23]
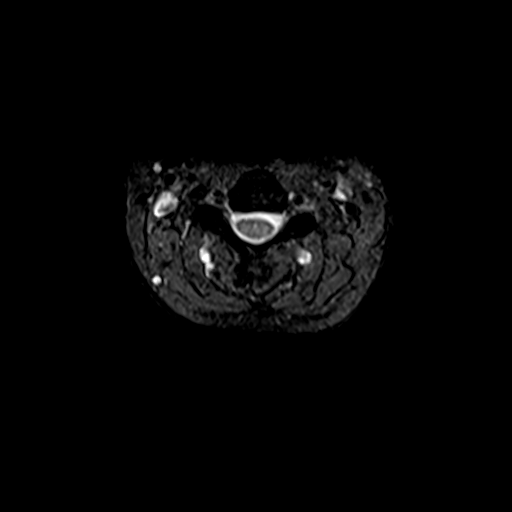

[39 of 48 positions shown; findings below may reference images not displayed]

FINDINGS: Mild intermittent motion degradation. Most notably, there is mild
motion degradation of the sagittal STIR sequence.

Alignment: Cervical levocurvature. Reversal of the expected cervical
lordosis. 2 mm C4-C5 grade 1 anterolisthesis.

Vertebrae: Vertebral body height is maintained. Mild degenerative
endplate edema at C6-C7. No significant marrow edema is identified
elsewhere. No focal suspicious osseous lesion.

Cord: No spinal cord signal abnormality is identified.

Posterior Fossa, vertebral arteries, paraspinal tissues: Posterior
fossa better assessed on concurrently performed MRI. Flow voids
preserved within the imaged cervical vertebral arteries. Paraspinal
soft tissues within normal limits. Incidentally noted 7 mm T2
hyperintense focus in the region of the right glossotonsillar
sulcus/lingual tonsils (series 5, image 3).

Disc levels:

Mild-to-moderate disc degeneration at C5-C6 and C6-C7. No more than
mild disc degeneration at the remaining levels.

C2-C3: No significant disc herniation or stenosis.

C3-C4: Uncovertebral hypertrophy on the right. No significant disc
herniation or spinal canal stenosis. Mild relative right neural
foraminal narrowing.

C4-C5: Grade 1 anterolisthesis. Disc uncovering. No significant disc
herniation, spinal canal stenosis or neural foraminal narrowing.

C5-C6: Shallow disc bulge. Minimal uncovertebral hypertrophy on the
right. No significant spinal canal or foraminal stenosis.

C6-C7: Shallow disc bulge. Minimal endplate spurring and
uncovertebral hypertrophy. No significant spinal canal or neural
foraminal narrowing. Small nerve root sheath cyst within the left
neural foramen.

C7-T1: No significant disc herniation or stenosis.

Impression 4 will be called to the ordering clinician or
representative by the Radiologist Assistant, and communication
documented in the PACS or [REDACTED].
IMPRESSION: Mild intermittent motion degradation.

Reversal of the expected cervical lordosis. 2 mm C4-C5 grade 1
anterolisthesis.

Cervical spondylosis as outlined. No significant spinal canal
stenosis at any level. At C3-C4, uncovertebral hypertrophy
contributes to mild relative right neural foraminal narrowing. Disc
degeneration is greatest at C5-C6 and C6-C7 (mild/moderate). Mild
degenerative endplate edema at C6-C7.

Incidentally noted, there is a 7 mm T2 hyperintense focus in the
region of the right glossotonsillar sulcus/lingual tonsils. This
finding is incompletely characterized on the current exam. Consider
a contrast-enhanced neck CT for further evaluation.

## 2022-06-14 ENCOUNTER — Encounter: Payer: Self-pay | Admitting: Gastroenterology

## 2022-06-14 ENCOUNTER — Ambulatory Visit: Payer: 59 | Admitting: Gastroenterology

## 2022-06-14 VITALS — BP 132/74 | HR 68 | Ht <= 58 in | Wt 91.6 lb

## 2022-06-14 DIAGNOSIS — K30 Functional dyspepsia: Secondary | ICD-10-CM | POA: Diagnosis not present

## 2022-06-14 DIAGNOSIS — R12 Heartburn: Secondary | ICD-10-CM | POA: Diagnosis not present

## 2022-06-14 NOTE — Progress Notes (Signed)
[name]    [id number]    [birth year]  Primary Care Physician:Dsa, Laurell Roof, MD  Referring Physician: Alferd Apa, MD [address] 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   She also used Homeopathic and Ayurvedic with no lasting improvement  She continues to have significant heartburn, especially worse when she lays down, she feels acid come up on in the right side of her throat, her gum and teeth hurt and her sputum turns white.  She also feels burning sensation in both her thighs, calves and arms when she starts having acid come up her throat. Pantoprazole and Pepcid makes her symptoms of acid reflux worse   She feels abdominal pain, bloating and discomfort has improved significantly after she took Xifaxan.   She completed almost 4 weeks course of Xifaxan in 2022, She also used "Prawal panchamrit rasa, ayurvedic medicine".     She was recommended to undergo EGD along with 48-hour pH Bravo study, patient canceled the procedure that was scheduled during last visit.  She is interested in fundoplication, is reluctant to undergo pH testing at this point.  She was also referred to [facility] gastroenterology for second opinion, she has not been able to schedule that appointment yet  GI Hx: Breath hydrogen test: positve   Hydrogen increase over baseline 37  (Normal <20)   Methane peak 11 (<10)    She has had multiple imaging including MRI enterography, CT abdomen pelvis done by her previous GI in [facility]   She had colonoscopy with biopsies at [facility] in May 2021,  was normal . Full report not available in epic   Outpatient Encounter Medications as of 06/14/2022  Medication Sig   Calcium Carb-Cholecalciferol (CALCIUM 1000 + D) 1000-800 MG-UNIT TABS Take 1 tablet by mouth as needed.   cholecalciferol (VITAMIN D3) 25 MCG (1000 UNIT) tablet Take 1 tablet by mouth. Three times a week as needed   cyanocobalamin (,VITAMIN B-12,) 1000 MCG/ML injection Inject 1,000 mcg into the muscle. twice a month   estradiol (ESTRACE) 1 MG tablet Take 0.5 mg by mouth daily.   famotidine (PEPCID) 40 MG tablet Take by mouth.   gabapentin (NEURONTIN) 300 MG capsule Take 300 mg by mouth at bedtime.   levothyroxine (SYNTHROID) 50 MCG tablet Take 50 mcg by mouth daily before breakfast.   pravastatin (PRAVACHOL) 20 MG tablet Take 20 mg by mouth daily.   zolpidem (AMBIEN) 10 MG tablet Take 10 mg by mouth at bedtime as needed for sleep.   No facility-administered encounter medications on file as of 06/14/2022.    Allergies as of 06/14/2022 - Review Complete 06/14/2022  Allergen Reaction Noted   Gadolinium derivatives Hives 02/13/2021   Lexapro [escitalopram] Itching 12/24/2020   Other  05/06/2020   Soma [carisoprodol] Itching 12/24/2020   Tramadol Itching 12/24/2020    Past Medical History:  Diagnosis Date   Fibromyalgia    GERD (gastroesophageal reflux disease)    HLD (hyperlipidemia)    Hypoglycemia    Hypothyroidism    Insomnia    Prediabetes  Past Surgical History:  Procedure Laterality Date   APPENDECTOMY     FACIAL RECONSTRUCTION SURGERY     LAPAROTOMY     UTERINE FIBROID SURGERY     VAGINAL HYSTERECTOMY     complete    Family History  Problem Relation Age of Onset   Diabetes Mother    Hypertension Mother    Heart failure Mother    Irritable bowel syndrome Mother    Hypothyroidism Mother    Atrial fibrillation Mother    Hyperlipidemia Mother    Hypertension Father    Heart disease Father    Hyperlipidemia Father    Diabetes Maternal  Grandmother    Hypertension Maternal Grandmother    Hypertension Maternal Grandfather    Hypertension Paternal Grandmother    Hypertension Paternal Grandfather     Social History   Socioeconomic History   Marital status: Single    Spouse name: Not on file   Number of children: 0   Years of education: Not on file   Highest education level: Not on file  Occupational History   Occupation: retired  Tobacco Use   Smoking status: Never   Smokeless tobacco: Never  Vaping Use   Vaping Use: Never used  Substance and Sexual Activity   Alcohol use: Not Currently   Drug use: Never   Sexual activity: Not on file  Other Topics Concern   Not on file  Social History Narrative   Not on file   Social Determinants of Health   Financial Resource Strain: Not on file  Food Insecurity: Not on file  Transportation Needs: Not on file  Physical Activity: Not on file  Stress: Not on file  Social Connections: Not on file  Intimate Partner Violence: Not on file      Review of systems: All other review of systems negative except as mentioned in the HPI.   Physical Exam: Vitals:   06/14/22 0929  BP: 132/74  Pulse: 68   Body mass index is 19.82 kg/m. Gen:      No acute distress HEENT:  sclera anicteric Abd:      soft, non-tender; no palpable masses, no distension Ext:    No edema Neuro: alert and oriented x 3 Psych: normal mood and affect  Data Reviewed:  Reviewed labs, radiology imaging, old records and pertinent past GI work up   Assessment and Plan/Recommendations:  56 year old very pleasant female with multiple GI complaints including burning sensation, heartburn, fatigue, abdominal bloating and excessive gas  She continues to have heartburn and symptoms of acid reflux/LPR She was scheduled EGD with 48-hour pH Bravo study to differentiate between functional heartburn versus pathologic gastroesophageal acid reflux, she canceled the procedure She is interested in pursuing  antireflux surgery. The risks and benefits as well as alternatives of endoscopic procedure(s) have been discussed and reviewed. All questions answered. The patient agrees to proceed.  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 The patient was provided an opportunity to ask questions and all were answered. The patient  agreed with the plan and demonstrated an understanding of the instructions.  Iona Beard , MD    CC: Alferd Apa, MD

## 2022-06-16 ENCOUNTER — Encounter: Payer: Self-pay | Admitting: Gastroenterology

## 2022-06-18 NOTE — Progress Notes (Unsigned)
HPI: [name] is a 56 y.o. female, who is here today to establish care.  Former PCP: Dr Iris Pert Last preventive routine visit: Over a year ago.  Chronic medical problems: Hypothyroidism, hyperlipidemia, fibromyalgia,chronic rhinitis, and GERD among some. Hyperlipidemia: Currently she is on nonpharmacologic treatment.  -GERD: She has seen ENT and follows with GI. It is not well controlled. Protonix 40 mg twice daily and famotidine 40 mg twice daily has been recommended, she is not taking these meds at this time because they seem to exacerbate symptoms. Certain foods also aggravate problem. She still has acid reflux when sleeping with elevated head, on a recliner; which is also aggravating back pain. Chronic lower middle back pain, sometimes radiated to post thighs. Negative for numbness,occasionally tingling sensation. According to pt, she has been recommended epidural injections. Problem getting worse. Reporting history of a scoliosis, she feels like it is getting worse and contributing to her back pain.  She would like to see orthopedist.  -Insomnia: Problem has been going on for years.  She is currently on Ambien 10 mg, she takes 1/2 to 1 tablet daily at bedtime. Medication has helped with falling asleep but she still waking up earlier, getting about 3 to 4 hours of sleep. She denies history of depression or anxiety. States that her sister, has ho is a physician,mentioned that she snores loud and that she should be evaluated for sleep apnea.  Gabapentin was prescribed to treat burning sensation of upper and lower extremities. She has followed with neuro. She does not take Gabapentin often before burning sensation has improved. Gabapentin helped her sleep.   -Acquired hypothyroidism with Hashimoto's disease, she followed with endocrinologist, Dr. Sharl Ma. She has been following with PCP for the past year or so. Reporting hx of thyroid nodules, states that annual [place] was recommended.   Thyroid US 05/2019: No discrete thyroid nodules. The thyroid gland is diffusely heterogeneous bilaterally with multiple tiny cysts. TSH 05/2021 was 0.9.  -Injured right great toe a few days ago with furniture, no limitation of ROM, some edema and pain with palpation, no ecchymosis.  She would like to schedule a CPE in a few weeks and have labs prior to appointment. HLD on non pharmacologic treatment.  Last FLP 06/18/21.   Ref Range & Units 1 yr ago  Cholesterol, Total 100 - 199 mg/dL 702 High    Triglycerides 0 - 149 mg/dL 637   HDL >85 mg/dL 55   VLDL Cholesterol Cal 5 - 40 mg/dL 22   LDL 0 - 99 mg/dL 885 High     Review of Systems  Constitutional:  Positive for fatigue. Negative for activity change, appetite change and fever.  HENT:  Negative for mouth sores and nosebleeds.   Eyes:  Negative for redness and visual disturbance.  Respiratory:  Negative for cough, shortness of breath and wheezing.   Cardiovascular:  Negative for chest pain, palpitations and leg swelling.  Gastrointestinal:  Negative for abdominal pain, nausea and vomiting.       Negative for changes in bowel habits.  Endocrine: Negative for cold intolerance and heat intolerance.  Genitourinary:  Negative for decreased urine volume, dysuria and hematuria.  Musculoskeletal:  Positive for arthralgias and back pain. Negative for gait problem.  Skin:  Negative for rash.  Neurological:  Negative for syncope, weakness and headaches.  Psychiatric/Behavioral:  Positive for sleep disturbance. Negative for confusion. The patient is nervous/anxious.   Rest see pertinent positives and negatives per HPI.  Current Outpatient Medications  on File Prior to Visit  Medication Sig Dispense Refill   Calcium Carb-Cholecalciferol (CALCIUM 1000 + D) 1000-800 MG-UNIT TABS Take 1 tablet by mouth as needed.     cholecalciferol (VITAMIN D3) 25 MCG (1000 UNIT) tablet Take 1 tablet by mouth. Three times a week as needed     cyanocobalamin  (,VITAMIN B-12,) 1000 MCG/ML injection Inject 1,000 mcg into the muscle. twice a month     estradiol (ESTRACE) 1 MG tablet Take 0.5 mg by mouth daily.     famotidine (PEPCID) 40 MG tablet Take by mouth.     gabapentin (NEURONTIN) 300 MG capsule Take 300 mg by mouth at bedtime.     levothyroxine (SYNTHROID) 50 MCG tablet Take 50 mcg by mouth daily before breakfast.     pantoprazole (PROTONIX) 40 MG tablet Take 40 mg by mouth 2 (two) times daily.     No current facility-administered medications on file prior to visit.   Past Medical History:  Diagnosis Date   Fibromyalgia    GERD (gastroesophageal reflux disease)    HLD (hyperlipidemia)    Hypoglycemia    Hypothyroidism    Insomnia    Prediabetes    Allergies  Allergen Reactions   Gadolinium Derivatives Hives    Pre dose prior to contrast scans per Rad   Lexapro [Escitalopram] Itching   Other     Patient does not want any narcotics- makes her nauseous   Soma [Carisoprodol] Itching   Tramadol Itching    Ultram    Family History  Problem Relation Age of Onset   Diabetes Mother    Hypertension Mother    Heart failure Mother    Irritable bowel syndrome Mother    Hypothyroidism Mother    Atrial fibrillation Mother    Hyperlipidemia Mother    Hypertension Father    Heart disease Father    Hyperlipidemia Father    Diabetes Maternal Grandmother    Hypertension Maternal Grandmother    Hypertension Maternal Grandfather    Hypertension Paternal Grandmother    Hypertension Paternal Grandfather    Social History   Socioeconomic History   Marital status: Single    Spouse name: Not on file   Number of children: 0   Years of education: Not on file   Highest education level: Not on file  Occupational History   Occupation: retired  Tobacco Use   Smoking status: Never   Smokeless tobacco: Never  Vaping Use   Vaping Use: Never used  Substance and Sexual Activity   Alcohol use: Not Currently   Drug use: Never   Sexual  activity: Not on file  Other Topics Concern   Not on file  Social History Narrative   Not on file   Social Determinants of Health   Financial Resource Strain: Not on file  Food Insecurity: Not on file  Transportation Needs: Not on file  Physical Activity: Not on file  Stress: Not on file  Social Connections: Not on file   Vitals:   06/21/22 0736  BP: 100/70  Pulse: 83  Resp: 12  SpO2: 98%   Body mass index is 20.13 kg/m.  Physical Exam Vitals and nursing note reviewed.  Constitutional:      General: She is not in acute distress.    Appearance: She is well-developed.  HENT:     Head: Normocephalic and atraumatic.     Mouth/Throat:     Mouth: Mucous membranes are moist.     Pharynx: Oropharynx is clear.  Eyes:     Conjunctiva/sclera: Conjunctivae normal.  Cardiovascular:     Rate and Rhythm: Normal rate and regular rhythm.     Pulses:          Dorsalis pedis pulses are 2+ on the right side and 2+ on the left side.     Heart sounds: No murmur heard. Pulmonary:     Effort: Pulmonary effort is normal. No respiratory distress.     Breath sounds: Normal breath sounds.  Abdominal:     Palpations: Abdomen is soft. There is no hepatomegaly or mass.     Tenderness: There is no abdominal tenderness.  Musculoskeletal:     Thoracic back: Scoliosis present.     Right foot: Normal range of motion. Tenderness present. No crepitus.     Comments: Mild edema of right great toe, no limitation of ROM. Tenderness upon palpation of proximal phalange, no deformities. Toe nail with nail polish.   Lymphadenopathy:     Cervical: No cervical adenopathy.  Skin:    General: Skin is warm.     Findings: No erythema or rash.  Neurological:     General: No focal deficit present.     Mental Status: She is alert and oriented to person, place, and time.     Cranial Nerves: No cranial nerve deficit.     Gait: Gait normal.  Psychiatric:     Comments: Well groomed, good eye contact.    ASSESSMENT AND PLAN:  Ms.Rylinn was seen today for establish care.  Diagnoses and all orders for this visit: Orders Placed This Encounter  Procedures   US THYROID   Comprehensive metabolic panel   Hemoglobin A1c   Lipid panel   TSH   T4, free   Ambulatory referral to Orthopedic Surgery   Ambulatory referral to Sleep Studies   History of thyroid cyst -     US THYROID; Future  Injury of toe on right foot, initial encounter We do not have X ray service today, I do nit think she needs it at this time. Hard sole shoe recommended for now.  Insomnia Problem does not seem to be well controlled. She agrees with trying Ambien CR 12.5 mg daily and stopping Ambien 10 mg. Continue adequate sleep hygiene. She could also add gabapentin at bedtime, which has helped with sleep in the past.  Hypothyroidism due to Hashimoto's thyroiditis Problem has been well controlled. Continue levothyroxine 50 mcg daily.  Gastroesophageal reflux disease Problem is not well controlled. Continue GERD precautions. She had a "mild" hiatal hernia, which could be contributed to symptoms. PPIs and other medications have not helped. Continue following with gastroenterologist.  Chronic midline low back pain with bilateral sciatica Problem is getting worse. Ortho referral placed.  Hyperlipidemia Continue nonpharmacologic treatment. We will arrange lab appointment for fasting labs and recommendation will be given according to results.  Diabetes mellitus screening -     Comprehensive metabolic panel; Future -     Hemoglobin A1c; Future  Loud snoring Sleep study ordered as requested.  I spent a total of 48 minutes in both face to face and non face to face activities for this visit on the date of this encounter. During this time history was obtained and documented, examination was performed, prior labs/imaging reviewed, and assessment/plan discussed.  Return in about 6 weeks (around 08/02/2022) for cpe,  labs 3-5 days before..  Raydan Schlabach G. [place], MD  [facility]. [place] [facility].

## 2022-06-21 ENCOUNTER — Encounter: Payer: Self-pay | Admitting: Family Medicine

## 2022-06-21 ENCOUNTER — Ambulatory Visit: Payer: Self-pay | Admitting: Family Medicine

## 2022-06-21 ENCOUNTER — Ambulatory Visit (INDEPENDENT_AMBULATORY_CARE_PROVIDER_SITE_OTHER): Payer: 59 | Admitting: Family Medicine

## 2022-06-21 VITALS — BP 100/70 | HR 83 | Resp 12 | Ht <= 58 in | Wt 93.0 lb

## 2022-06-21 DIAGNOSIS — R0683 Snoring: Secondary | ICD-10-CM | POA: Diagnosis not present

## 2022-06-21 DIAGNOSIS — M5441 Lumbago with sciatica, right side: Secondary | ICD-10-CM

## 2022-06-21 DIAGNOSIS — S99921A Unspecified injury of right foot, initial encounter: Secondary | ICD-10-CM

## 2022-06-21 DIAGNOSIS — E063 Autoimmune thyroiditis: Secondary | ICD-10-CM

## 2022-06-21 DIAGNOSIS — E785 Hyperlipidemia, unspecified: Secondary | ICD-10-CM | POA: Diagnosis not present

## 2022-06-21 DIAGNOSIS — G4700 Insomnia, unspecified: Secondary | ICD-10-CM | POA: Insufficient documentation

## 2022-06-21 DIAGNOSIS — Z131 Encounter for screening for diabetes mellitus: Secondary | ICD-10-CM | POA: Diagnosis not present

## 2022-06-21 DIAGNOSIS — Z8639 Personal history of other endocrine, nutritional and metabolic disease: Secondary | ICD-10-CM

## 2022-06-21 DIAGNOSIS — M5442 Lumbago with sciatica, left side: Secondary | ICD-10-CM | POA: Diagnosis not present

## 2022-06-21 DIAGNOSIS — G8929 Other chronic pain: Secondary | ICD-10-CM | POA: Diagnosis not present

## 2022-06-21 DIAGNOSIS — K219 Gastro-esophageal reflux disease without esophagitis: Secondary | ICD-10-CM | POA: Insufficient documentation

## 2022-06-21 DIAGNOSIS — E038 Other specified hypothyroidism: Secondary | ICD-10-CM

## 2022-06-21 MED ORDER — ZOLPIDEM TARTRATE ER 12.5 MG PO TBCR: 12.5000 mg | EXTENDED_RELEASE_TABLET | Freq: Every evening | ORAL | 0 refills | Status: DC | PRN

## 2022-06-21 NOTE — Assessment & Plan Note (Signed)
Problem is not well controlled. Continue GERD precautions. She had a "mild" hiatal hernia, which could be contributed to symptoms. PPIs and other medications have not helped. Continue following with gastroenterologist.

## 2022-06-21 NOTE — Assessment & Plan Note (Signed)
Continue nonpharmacologic treatment. We will arrange lab appointment for fasting labs and recommendation will be given according to results.

## 2022-06-21 NOTE — Assessment & Plan Note (Signed)
Problem has been well controlled. Continue levothyroxine 50 mcg daily.

## 2022-06-21 NOTE — Patient Instructions (Addendum)
A few things to remember from today's visit:  Chronic midline low back pain with bilateral sciatica - Plan: Ambulatory referral to Orthopedic Surgery  Insomnia, unspecified type - Plan: zolpidem (AMBIEN CR) 12.5 MG CR tablet  Hypothyroidism due to Hashimoto's thyroiditis - Plan: US THYROID  History of thyroid cyst  Injury of toe on right foot, initial encounter  If you need refills please call your pharmacy. Do not use My Chart to request refills or for acute issues that need immediate attention.   I do not think X ray is needed today. Wear comfortable shoes. Continue following with gastro for GERD. Appt with ortho will be arrange and for thyroid ultrasound. Stop Ambien and try Ambien CR for sleep. No changes in rest. Labs a week before physical can be arranged.  Please be sure medication list is accurate. If a new problem present, please set up appointment sooner than planned today.

## 2022-06-21 NOTE — Assessment & Plan Note (Signed)
Problem does not seem to be well controlled. She agrees with trying Ambien CR 12.5 mg daily and stopping Ambien 10 mg. Continue adequate sleep hygiene. She could also add gabapentin at bedtime, which has helped with sleep in the past.

## 2022-06-21 NOTE — Assessment & Plan Note (Signed)
Problem is getting worse. Ortho referral placed.

## 2022-06-29 ENCOUNTER — Other Ambulatory Visit (INDEPENDENT_AMBULATORY_CARE_PROVIDER_SITE_OTHER): Payer: 59

## 2022-06-29 DIAGNOSIS — E038 Other specified hypothyroidism: Secondary | ICD-10-CM | POA: Diagnosis not present

## 2022-06-29 DIAGNOSIS — E785 Hyperlipidemia, unspecified: Secondary | ICD-10-CM

## 2022-06-29 DIAGNOSIS — Z131 Encounter for screening for diabetes mellitus: Secondary | ICD-10-CM | POA: Diagnosis not present

## 2022-06-29 DIAGNOSIS — E063 Autoimmune thyroiditis: Secondary | ICD-10-CM

## 2022-06-29 LAB — COMPREHENSIVE METABOLIC PANEL
ALT: 9 U/L (ref 0–35)
AST: 14 U/L (ref 0–37)
Albumin: 4.3 g/dL (ref 3.5–5.2)
Alkaline Phosphatase: 61 U/L (ref 39–117)
BUN: 10 mg/dL (ref 6–23)
CO2: 29 mEq/L (ref 19–32)
Calcium: 9.3 mg/dL (ref 8.4–10.5)
Chloride: 103 mEq/L (ref 96–112)
Creatinine, Ser: 0.67 mg/dL (ref 0.40–1.20)
GFR: 98.35 mL/min (ref >60.00)
Glucose, Bld: 105 mg/dL — ABNORMAL HIGH (ref 70–99)
Potassium: 4.1 mEq/L (ref 3.5–5.1)
Sodium: 138 mEq/L (ref 135–145)
Total Bilirubin: 1.4 mg/dL — ABNORMAL HIGH (ref 0.2–1.2)
Total Protein: 7.1 g/dL (ref 6.0–8.3)

## 2022-06-29 LAB — LIPID PANEL
Cholesterol: 256 mg/dL — ABNORMAL HIGH (ref 0–200)
HDL: 56.7 mg/dL (ref >39.00)
LDL Cholesterol: 172 mg/dL — ABNORMAL HIGH (ref 0–99)
NonHDL: 198.99
Total CHOL/HDL Ratio: 5
Triglycerides: 137 mg/dL (ref 0.0–149.0)
VLDL: 27.4 mg/dL (ref 0.0–40.0)

## 2022-06-29 LAB — HEMOGLOBIN A1C: Hgb A1c MFr Bld: 6.6 % — ABNORMAL HIGH (ref 4.6–6.5)

## 2022-06-29 LAB — T4, FREE: Free T4: 0.85 ng/dL (ref 0.60–1.60)

## 2022-06-29 LAB — TSH: TSH: 14.68 u[IU]/mL — ABNORMAL HIGH (ref 0.35–5.50)

## 2022-06-30 NOTE — Progress Notes (Unsigned)
HPI: [name] is a 56 y.o. female, who is here today for her routine physical.  Last CPE: Over a year ago. She exercises regularly, walks 1-2 miles daily. She is vegetarian, eats "a lot of" tortillas.She cooks at home.  Chronic medical problems: Hypothyroidism/hashimoto thyroiditis,fibromyalgia,numbness/tingling, GERD,insomnia,chronic back pain, and HLD among some.  Immunization History  Administered Date(s) Administered   Influenza,inj,quad, With Preservative 10/21/2020   Influenza-Unspecified 10/21/2020   PFIZER Comirnaty(Gray Top)Covid-19 Tri-Sucrose Vaccine 03/16/2020, 04/18/2020   PFIZER(Purple Top)SARS-COV-2 Vaccination 03/15/2020, 03/16/2020, 04/18/2020, 12/10/2020   Pfizer Covid-19 Vaccine Bivalent Booster 20yr & up 09/23/2021   Tdap 06/26/2010, 04/11/2021   Health Maintenance  Topic Date Due   FOOT EXAM  Never done   OPHTHALMOLOGY EXAM  Never done   URINE MICROALBUMIN  Never done   Hepatitis C Screening  Never done   Zoster Vaccines- Shingrix (1 of 2) Never done   INFLUENZA VACCINE  07/20/2022   HEMOGLOBIN A1C  12/30/2022   MAMMOGRAM  10/07/2023   COLONOSCOPY (Pts 45-457yrInsurance coverage will need to be confirmed)  05/01/2030   TETANUS/TDAP  04/12/2031   COVID-19 Vaccine  Completed   HIV Screening  Completed   HPV VACCINES  Aged Out   PAP SMEAR-Modifier  Discontinued  Mammogram 09/29/21. S/P hysterectomy.  Colonoscopy 05/01/20. Follows with GI for severe GERD.  DEXA 10/06/21 osteoporosis. She has tried Fosamax x at least 5 years. Also took Boniva. She takes Vit D, does not take Ca++ supplementation.  Had blood work done recently. HLD: She is on non pharmacologic treatment.  Lab Results  Component Value Date   CHOL 256 (H) 06/29/2022   HDL 56.70 06/29/2022   LDLCALC 172 (H) 06/29/2022   TRIG 137.0 06/29/2022   CHOLHDL 5 06/29/2022   Lab Results  Component Value Date   CREATININE 0.67 06/29/2022   BUN 10 06/29/2022   NA 138 06/29/2022    K 4.1 06/29/2022   CL 103 06/29/2022   CO2 29 06/29/2022   Lab Results  Component Value Date   ALT 9 06/29/2022   AST 14 06/29/2022   ALKPHOS 61 06/29/2022   BILITOT 1.4 (H) 06/29/2022   Hypothyroidism: She is on Levothyroxine 50 mcg daily.She noted that she got a new generic about 3 months ago. +Fatigue,brittle nails,and dry skin.  Lab Results  Component Value Date   TSH 14.68 (H) 06/29/2022   Insomnia: Last visit Ambien CR 12.5 mg was started, did not fill Rx because cost, it seems cheaper if she can get 3 months supply using [facility].  HgA1C went from 6.2 to 6.6. New onset DM II. Hx of hypoglycemias, 52-58. Feels "shaky"and diaphoretic, had one episode this week, not always checks BS, symptoms improve with food intake.  Lab Results  Component Value Date   HGBA1C 6.6 (H) 06/29/2022   Vit D deficiency and B12 deficiency, she is on supplementation. 25 OH vit D 29.9 in 05/2021.  Reporting hx of iron def anemia, would like CBC and iron studies done. 06/18/21: WBC 3.4 - 10.8 x10E3/uL 3.9   RBC 3.77 - 5.28 x10E6/uL 4.24   Hemoglobin 11.1 - 15.9 g/dL 11.0 Low    Hematocrit 34.0 - 46.6 % 35.1   MCV 79 - 97 fL 83   MCH 26.6 - 33.0 pg 25.9 Low    MCHC 31.5 - 35.7 g/dL 31.3 Low    RDW 11.7 - 15.4 % 14.5   Platelet Count 150 - 450 x10E3/uL 322   Neutrophils Not Estab. % 56  Lymphs Relative  Not Estab. % 31   Monocytes Not Estab. % 11   Eos Relative  Not Estab. % 1   Basos Relative  Not Estab. % 1   Neutrophils Absolute 1.4 - 7.0 x10E3/uL 2.2   Lymphocytes Absolute 0.7 - 3.1 x10E3/uL 1.2   Monocytes Absolute 0.1 - 0.9 x10E3/uL 0.4   Eosinophils Absolute 0.0 - 0.4 x10E3/uL 0.1   Basophils Absolute 0.0 - 0.2 x10E3/uL 0.0   Immature Granulocytes Not Estab. % 0   Immature Grans (Abs) 0.0 - 0.1 x10E3/uL 0.0    Review of Systems  Constitutional:  Positive for fatigue. Negative for activity change, appetite change and fever.  HENT:  Negative for hearing loss, mouth sores and  sore throat.   Eyes:  Negative for redness and visual disturbance.  Respiratory:  Negative for cough, shortness of breath and wheezing.   Cardiovascular:  Negative for chest pain and leg swelling.  Gastrointestinal:  Positive for nausea (chronic). Negative for abdominal pain and vomiting.       No changes in bowel habits.  Endocrine: Negative for cold intolerance, heat intolerance, polydipsia, polyphagia and polyuria.  Genitourinary:  Negative for decreased urine volume, dysuria, hematuria, vaginal bleeding and vaginal discharge.  Musculoskeletal:  Positive for back pain and myalgias (LE cramps.). Negative for gait problem.  Skin:  Negative for color change and rash.  Allergic/Immunologic: Negative for environmental allergies.  Neurological:  Positive for dizziness, numbness and headaches. Negative for syncope and weakness.  Hematological:  Negative for adenopathy. Does not bruise/bleed easily.  Psychiatric/Behavioral:  Positive for sleep disturbance. Negative for confusion.   All other systems reviewed and are negative.  Current Outpatient Medications on File Prior to Visit  Medication Sig Dispense Refill   Calcium Carb-Cholecalciferol (CALCIUM 1000 + D) 1000-800 MG-UNIT TABS Take 1 tablet by mouth as needed.     cholecalciferol (VITAMIN D3) 25 MCG (1000 UNIT) tablet Take 1 tablet by mouth. Three times a week as needed     cyanocobalamin (,VITAMIN B-12,) 1000 MCG/ML injection Inject 1,000 mcg into the muscle. twice a month     estradiol (ESTRACE) 1 MG tablet Take 0.5 mg by mouth daily.     famotidine (PEPCID) 40 MG tablet Take by mouth.     gabapentin (NEURONTIN) 300 MG capsule Take 300 mg by mouth at bedtime.     pantoprazole (PROTONIX) 40 MG tablet Take 40 mg by mouth 2 (two) times daily.     No current facility-administered medications on file prior to visit.   Past Medical History:  Diagnosis Date   Fibromyalgia    GERD (gastroesophageal reflux disease)    HLD (hyperlipidemia)     Hypoglycemia    Hypothyroidism    Insomnia    Prediabetes    Past Surgical History:  Procedure Laterality Date   APPENDECTOMY     FACIAL RECONSTRUCTION SURGERY     LAPAROTOMY     UTERINE FIBROID SURGERY     VAGINAL HYSTERECTOMY     complete   Allergies  Allergen Reactions   Gadolinium Derivatives Hives    Pre dose prior to contrast scans per Rad   Lexapro [Escitalopram] Itching   Other     Patient does not want any narcotics- makes her nauseous   Soma [Carisoprodol] Itching   Tramadol Itching    Ultram   Family History  Problem Relation Age of Onset   Diabetes Mother    Hypertension Mother    Heart failure Mother    Irritable  bowel syndrome Mother    Hypothyroidism Mother    Atrial fibrillation Mother    Hyperlipidemia Mother    Hypertension Father    Heart disease Father    Hyperlipidemia Father    Diabetes Maternal Grandmother    Hypertension Maternal Grandmother    Hypertension Maternal Grandfather    Hypertension Paternal Grandmother    Hypertension Paternal Grandfather     Social History   Socioeconomic History   Marital status: Single    Spouse name: Not on file   Number of children: 0   Years of education: Not on file   Highest education level: Not on file  Occupational History   Occupation: retired  Tobacco Use   Smoking status: Never   Smokeless tobacco: Never  Vaping Use   Vaping Use: Never used  Substance and Sexual Activity   Alcohol use: Not Currently   Drug use: Never   Sexual activity: Not on file  Other Topics Concern   Not on file  Social History Narrative   Not on file   Social Determinants of Health   Financial Resource Strain: Not on file  Food Insecurity: Not on file  Transportation Needs: Not on file  Physical Activity: Not on file  Stress: Not on file  Social Connections: Not on file   Vitals:   07/02/22 1127  BP: 120/70  Pulse: 86  Resp: 12  SpO2: 99%   Body mass index is 20.21 kg/m.  Wt Readings from  Last 3 Encounters:  07/02/22 93 lb 6 oz (42.4 kg)  06/21/22 93 lb (42.2 kg)  06/14/22 91 lb 9.6 oz (41.5 kg)   Physical Exam Vitals and nursing note reviewed.  Constitutional:      General: She is not in acute distress.    Appearance: She is well-developed.  HENT:     Head: Normocephalic and atraumatic.     Right Ear: Hearing, tympanic membrane, ear canal and external ear normal.     Left Ear: Hearing, tympanic membrane, ear canal and external ear normal.     Mouth/Throat:     Mouth: Mucous membranes are moist.     Pharynx: Oropharynx is clear. Uvula midline.  Eyes:     Extraocular Movements: Extraocular movements intact.     Conjunctiva/sclera: Conjunctivae normal.     Pupils: Pupils are equal, round, and reactive to light.  Neck:     Thyroid: No thyromegaly.     Trachea: No tracheal deviation.  Cardiovascular:     Rate and Rhythm: Normal rate and regular rhythm.     Pulses:          Dorsalis pedis pulses are 2+ on the right side and 2+ on the left side.     Heart sounds: No murmur heard. Pulmonary:     Effort: Pulmonary effort is normal. No respiratory distress.     Breath sounds: Normal breath sounds.  Abdominal:     Palpations: Abdomen is soft. There is no hepatomegaly or mass.     Tenderness: There is no abdominal tenderness.  Genitourinary:    Comments: No concerns. Musculoskeletal:     Thoracic back: Scoliosis present.     Comments: No signs of synovitis appreciated.  Lymphadenopathy:     Cervical: No cervical adenopathy.     Upper Body:     Right upper body: No supraclavicular adenopathy.     Left upper body: No supraclavicular adenopathy.  Skin:    General: Skin is warm.     Findings: No  erythema or rash.  Neurological:     General: No focal deficit present.     Mental Status: She is alert and oriented to person, place, and time.     Cranial Nerves: No cranial nerve deficit.     Coordination: Coordination normal.     Gait: Gait normal.     Deep Tendon  Reflexes:     Reflex Scores:      Bicep reflexes are 2+ on the right side and 2+ on the left side.      Patellar reflexes are 2+ on the right side and 2+ on the left side. Psychiatric:     Comments: Well groomed, good eye contact.   ASSESSMENT AND PLAN:  [name] was here today annual physical examination.  Orders Placed This Encounter  Procedures   VITAMIN D 25 Hydroxy (Vit-D Deficiency, Fractures)   Vitamin B12   Lipid panel   Hepatic function panel   TSH   CBC with Differential/Platelet   Iron   Ferritin   Amb Referral to Nutrition and Diabetic Education   Routine general medical examination at a health care facility We discussed the importance of regular physical activity and healthy diet for prevention of chronic illness and/or complications. Preventive guidelines reviewed. Vaccination up tp date, would like to hold on shingrix. Ca++ and vit D supplementation recommended. Next CPE in a year.  Hypothyroidism due to Hashimoto's thyroiditis TSH is not at goal. Levothyroxine dose increased from 50 mcg to 88 mcg. TSH in 6-8 weeks. If still not at goal, we can try brand name Synthroid.  -     levothyroxine (SYNTHROID) 88 MCG tablet; Take 1 tablet (88 mcg total) by mouth daily.  Insomnia, unspecified type Problem is not well controlled. She has tried different medications. Ambien CR 12.5 mg 30 days supply with similar price that 3 months supply, new Rx sent. Good sleep hygiene also recommended. F/U in 3 months.  -     zolpidem (AMBIEN CR) 12.5 MG CR tablet; Take 1 tablet (12.5 mg total) by mouth at bedtime as needed for sleep.  Hyperlipidemia, unspecified hyperlipidemia type Pravastatin 20 mg daily started today. Low fat diet to continue. FLP and LFT's in 6-8 weeks.  -     pravastatin (PRAVACHOL) 20 MG tablet; Take 1 tablet (20 mg total) by mouth daily.  Type 2 diabetes mellitus with other specified complication, without long-term current use of insulin  (HCC) New Dx, HgA1C is at goal. I do not recommend pharmacologic treatment. Having some episodes of hypoglycemia, recommend increasing protein intake and decreasing carb intake. Nutrition consultation will be arranged. Annual eye exam, periodic dental and foot care recommended. F/U in 3-4 months  -     Blood Glucose Monitoring Suppl (ACCU-CHEK GUIDE) w/Device KIT; Use to test blood sugars 1-2 times daily.  B12 deficiency Continue B12 supplementation. B12 will be added to next blood work.  Vitamin D deficiency, unspecified Continue Vit D 1000 U daily. 25 OH vit D added to next blood work in 6-8 weeks.  Iron deficiency anemia, unspecified iron deficiency anemia type Mild. CBC with next blood work.  Localized osteoporosis without current pathological fracture Treatment options discussed. Reclast and Prolia. She agrees with trying Prolia, will arrange after labs in 6-8 weeks. Continue Vit D supplementation and regular physical activity and start Ca++ supplementation.  Total bilirubin, elevated Mild. Total bili has been 0.6-1.3. Will repeat LFT's in 6-8 weeks.  Return in 4 months (on 11/02/2022) for Fasting labs in  6-8 weeks..  Tripp Goins G. [place], MD  [facility]. [place] office.

## 2022-07-02 ENCOUNTER — Ambulatory Visit (INDEPENDENT_AMBULATORY_CARE_PROVIDER_SITE_OTHER): Payer: 59 | Admitting: Family Medicine

## 2022-07-02 ENCOUNTER — Encounter: Payer: Self-pay | Admitting: Family Medicine

## 2022-07-02 VITALS — BP 120/70 | HR 86 | Resp 12 | Ht <= 58 in | Wt 93.4 lb

## 2022-07-02 DIAGNOSIS — E538 Deficiency of other specified B group vitamins: Secondary | ICD-10-CM | POA: Diagnosis not present

## 2022-07-02 DIAGNOSIS — E559 Vitamin D deficiency, unspecified: Secondary | ICD-10-CM | POA: Diagnosis not present

## 2022-07-02 DIAGNOSIS — E1169 Type 2 diabetes mellitus with other specified complication: Secondary | ICD-10-CM | POA: Insufficient documentation

## 2022-07-02 DIAGNOSIS — M816 Localized osteoporosis [Lequesne]: Secondary | ICD-10-CM

## 2022-07-02 DIAGNOSIS — R17 Unspecified jaundice: Secondary | ICD-10-CM

## 2022-07-02 DIAGNOSIS — D510 Vitamin B12 deficiency anemia due to intrinsic factor deficiency: Secondary | ICD-10-CM | POA: Insufficient documentation

## 2022-07-02 DIAGNOSIS — Z0000 Encounter for general adult medical examination without abnormal findings: Secondary | ICD-10-CM

## 2022-07-02 DIAGNOSIS — E038 Other specified hypothyroidism: Secondary | ICD-10-CM

## 2022-07-02 DIAGNOSIS — E119 Type 2 diabetes mellitus without complications: Secondary | ICD-10-CM | POA: Insufficient documentation

## 2022-07-02 DIAGNOSIS — G4700 Insomnia, unspecified: Secondary | ICD-10-CM

## 2022-07-02 DIAGNOSIS — D509 Iron deficiency anemia, unspecified: Secondary | ICD-10-CM | POA: Diagnosis not present

## 2022-07-02 DIAGNOSIS — E063 Autoimmune thyroiditis: Secondary | ICD-10-CM | POA: Diagnosis not present

## 2022-07-02 DIAGNOSIS — E785 Hyperlipidemia, unspecified: Secondary | ICD-10-CM | POA: Diagnosis not present

## 2022-07-02 DIAGNOSIS — E139 Other specified diabetes mellitus without complications: Secondary | ICD-10-CM | POA: Insufficient documentation

## 2022-07-02 DIAGNOSIS — E109 Type 1 diabetes mellitus without complications: Secondary | ICD-10-CM | POA: Insufficient documentation

## 2022-07-02 MED ORDER — LEVOTHYROXINE SODIUM 88 MCG PO TABS: 88.0000 ug | ORAL_TABLET | Freq: Every day | ORAL | 0 refills | Status: DC

## 2022-07-02 MED ORDER — ZOLPIDEM TARTRATE ER 12.5 MG PO TBCR: 12.5000 mg | EXTENDED_RELEASE_TABLET | Freq: Every evening | ORAL | 0 refills | Status: DC | PRN

## 2022-07-02 MED ORDER — ACCU-CHEK AVIVA PLUS W/DEVICE KIT: PACK | 0 refills | Status: DC

## 2022-07-02 MED ORDER — ACCU-CHEK GUIDE W/DEVICE KIT: PACK | 0 refills | Status: DC

## 2022-07-02 MED ORDER — PRAVASTATIN SODIUM 20 MG PO TABS: 20.0000 mg | ORAL_TABLET | Freq: Every day | ORAL | 2 refills | Status: DC

## 2022-07-02 NOTE — Patient Instructions (Addendum)
A few things to remember from today's visit:  Routine general medical examination at a health care facility  Hypothyroidism due to Hashimoto's thyroiditis - Plan: levothyroxine (SYNTHROID) 88 MCG tablet, TSH  Insomnia, unspecified type - Plan: zolpidem (AMBIEN CR) 12.5 MG CR tablet, DISCONTINUED: zolpidem (AMBIEN CR) 12.5 MG CR tablet  Hyperlipidemia, unspecified hyperlipidemia type - Plan: pravastatin (PRAVACHOL) 20 MG tablet, Lipid panel, Hepatic function panel  Type 2 diabetes mellitus with other specified complication, without long-term current use of insulin (HCC) - Plan: Amb Referral to Nutrition and Diabetic Education  B12 deficiency - Plan: Vitamin B12  Vitamin D deficiency, unspecified - Plan: VITAMIN D 25 Hydroxy (Vit-D Deficiency, Fractures)  If you need refills please call your pharmacy. Do not use My Chart to request refills or for acute issues that need immediate attention.    Please be sure medication list is accurate. If a new problem present, please set up appointment sooner than planned today.        Health Maintenance, Female Adopting a healthy lifestyle and getting preventive care are important in promoting health and wellness. Ask your health care provider about: The right schedule for you to have regular tests and exams. Things you can do on your own to prevent diseases and keep yourself healthy. What should I know about diet, weight, and exercise? Eat a healthy diet  Eat a diet that includes plenty of vegetables, fruits, low-fat dairy products, and lean protein. Do not eat a lot of foods that are high in solid fats, added sugars, or sodium. Maintain a healthy weight Body mass index (BMI) is used to identify weight problems. It estimates body fat based on height and weight. Your health care provider can help determine your BMI and help you achieve or maintain a healthy weight. Get regular exercise Get regular exercise. This is one of the most important  things you can do for your health. Most adults should: Exercise for at least 150 minutes each week. The exercise should increase your heart rate and make you sweat (moderate-intensity exercise). Do strengthening exercises at least twice a week. This is in addition to the moderate-intensity exercise. Spend less time sitting. Even light physical activity can be beneficial. Watch cholesterol and blood lipids Have your blood tested for lipids and cholesterol at 55 years of age, then have this test every 5 years. Have your cholesterol levels checked more often if: Your lipid or cholesterol levels are high. You are older than 55 years of age. You are at high risk for heart disease. What should I know about cancer screening? Depending on your health history and family history, you may need to have cancer screening at various ages. This may include screening for: Breast cancer. Cervical cancer. Colorectal cancer. Skin cancer. Lung cancer. What should I know about heart disease, diabetes, and high blood pressure? Blood pressure and heart disease High blood pressure causes heart disease and increases the risk of stroke. This is more likely to develop in people who have high blood pressure readings or are overweight. Have your blood pressure checked: Every 3-5 years if you are 18-21 years of age. Every year if you are 53 years old or older. Diabetes Have regular diabetes screenings. This checks your fasting blood sugar level. Have the screening done: Once every three years after age 65 if you are at a normal weight and have a low risk for diabetes. More often and at a younger age if you are overweight or have a high risk for  diabetes. What should I know about preventing infection? Hepatitis B If you have a higher risk for hepatitis B, you should be screened for this virus. Talk with your health care provider to find out if you are at risk for hepatitis B infection. Hepatitis C Testing is  recommended for: Everyone born from [birth year] through 1964. Anyone with known risk factors for hepatitis C. Sexually transmitted infections (STIs) Get screened for STIs, including gonorrhea and chlamydia, if: You are sexually active and are younger than 55 years of age. You are older than 55 years of age and your health care provider tells you that you are at risk for this type of infection. Your sexual activity has changed since you were last screened, and you are at increased risk for chlamydia or gonorrhea. Ask your health care provider if you are at risk. Ask your health care provider about whether you are at high risk for HIV. Your health care provider may recommend a prescription medicine to help prevent HIV infection. If you choose to take medicine to prevent HIV, you should first get tested for HIV. You should then be tested every 3 months for as long as you are taking the medicine. Pregnancy If you are about to stop having your period (premenopausal) and you may become pregnant, seek counseling before you get pregnant. Take 400 to 800 micrograms (mcg) of folic acid every day if you become pregnant. Ask for birth control (contraception) if you want to prevent pregnancy. Osteoporosis and menopause Osteoporosis is a disease in which the bones lose minerals and strength with aging. This can result in bone fractures. If you are 58 years old or older, or if you are at risk for osteoporosis and fractures, ask your health care provider if you should: Be screened for bone loss. Take a calcium or vitamin D supplement to lower your risk of fractures. Be given hormone replacement therapy (HRT) to treat symptoms of menopause. Follow these instructions at home: Alcohol use Do not drink alcohol if: Your health care provider tells you not to drink. You are pregnant, may be pregnant, or are planning to become pregnant. If you drink alcohol: Limit how much you have to: 0-1 drink a day. Know how much  alcohol is in your drink. In the [place] one drink equals one 12 oz bottle of beer (355 mL), one 5 oz glass of wine (148 mL), or one 1 oz glass of hard liquor (44 mL). Lifestyle Do not use any products that contain nicotine or tobacco. These products include cigarettes, chewing tobacco, and vaping devices, such as e-cigarettes. If you need help quitting, ask your health care provider. Do not use street drugs. Do not share needles. Ask your health care provider for help if you need support or information about quitting drugs. General instructions Schedule regular health, dental, and eye exams. Stay current with your vaccines. Tell your health care provider if: You often feel depressed. You have ever been abused or do not feel safe at home. Summary Adopting a healthy lifestyle and getting preventive care are important in promoting health and wellness. Follow your health care provider's instructions about healthy diet, exercising, and getting tested or screened for diseases. Follow your health care provider's instructions on monitoring your cholesterol and blood pressure. This information is not intended to replace advice given to you by your health care provider. Make sure you discuss any questions you have with your health care provider. Document Revised: 04/27/2021 Document Reviewed: 04/27/2021 Elsevier Patient Education  2023  [employer].

## 2022-07-05 ENCOUNTER — Encounter: Payer: Self-pay | Admitting: Gastroenterology

## 2022-07-06 ENCOUNTER — Encounter: Payer: Self-pay | Admitting: Gastroenterology

## 2022-07-06 ENCOUNTER — Ambulatory Visit (AMBULATORY_SURGERY_CENTER): Payer: 59 | Admitting: Gastroenterology

## 2022-07-06 VITALS — BP 129/80 | HR 85 | Temp 97.3°F | Resp 17 | Ht 59.0 in | Wt 91.0 lb

## 2022-07-06 DIAGNOSIS — K2950 Unspecified chronic gastritis without bleeding: Secondary | ICD-10-CM | POA: Diagnosis not present

## 2022-07-06 DIAGNOSIS — K2960 Other gastritis without bleeding: Secondary | ICD-10-CM

## 2022-07-06 DIAGNOSIS — R12 Heartburn: Secondary | ICD-10-CM | POA: Diagnosis not present

## 2022-07-06 DIAGNOSIS — K31A0 Gastric intestinal metaplasia, unspecified: Secondary | ICD-10-CM | POA: Diagnosis not present

## 2022-07-06 MED ORDER — SODIUM CHLORIDE 0.9 % IV SOLN: 500.0000 mL | Freq: Once | INTRAVENOUS | Status: DC

## 2022-07-06 NOTE — Progress Notes (Signed)
Please refer to office visit note 06/14/22. No additional changes in H&P Patient is appropriate for planned procedure(s) and anesthesia in an ambulatory setting  K. Scherry Ran , MD [phone]

## 2022-07-06 NOTE — Progress Notes (Signed)
Called to room to assist during endoscopic procedure.  Patient ID and intended procedure confirmed with present staff. Received instructions for my participation in the procedure from the performing physician.   Capsule expiration date- 11/26/2023  Capsule ID number [id number]  LES measurement: 36 (capsule placed 6 cm above LES) 30  Time of implant: 1026

## 2022-07-06 NOTE — Op Note (Signed)
[facility] Patient Name: [name] Procedure Date: 07/06/2022 10:07 AM MRN: [id number] Endoscopist: Napoleon Form , MD Age: 56 Referring MD:  Date of Birth: 09-Jul-1966 Gender: Female Account #: [id number] Procedure:                Upper GI endoscopy Indications:              Functional Dyspepsia, Heartburn Medicines:                Monitored Anesthesia Care Procedure:                Pre-Anesthesia Assessment:                           - Prior to the procedure, a History and Physical                            was performed, and patient medications and                            allergies were reviewed. The patient's tolerance of                            previous anesthesia was also reviewed. The risks                            and benefits of the procedure and the sedation                            options and risks were discussed with the patient.                            All questions were answered, and informed consent                            was obtained. Prior Anticoagulants: The patient has                            taken no previous anticoagulant or antiplatelet                            agents. ASA Grade Assessment: II - A patient with                            mild systemic disease. After reviewing the risks                            and benefits, the patient was deemed in                            satisfactory condition to undergo the procedure.                           After obtaining informed consent, the endoscope was  passed under direct vision. Throughout the                            procedure, the patient's blood pressure, pulse, and                            oxygen saturations were monitored continuously. The                            Endoscope was introduced through the mouth, and                            advanced to the second part of duodenum. The upper                            GI endoscopy was  accomplished without difficulty.                            The patient tolerated the procedure well. Scope In: Scope Out: Findings:                 The Z-line was regular and was found 36 cm from the                            incisors.                           The examined esophagus was normal. The BRAVO                            capsule with delivery system was introduced through                            the mouth and advanced into the esophagus, such                            that the BRAVO pH capsule was positioned 30 cm from                            the incisors, which was 6 cm proximal to the GE                            junction. The BRAVO pH capsule was then deployed                            and attached to the esophageal mucosa. The delivery                            system was then withdrawn. Endoscopy was utilized                            for probe placement and diagnostic evaluation.  Patchy mild inflammation characterized by                            congestion (edema) and erythema was found in the                            entire examined stomach. Biopsies were taken with a                            cold forceps for Helicobacter pylori testing.                           The cardia and gastric fundus were normal on                            retroflexion.                           The examined duodenum was normal. Complications:            No immediate complications. Estimated Blood Loss:     Estimated blood loss was minimal. Impression:               - Z-line regular, 36 cm from the incisors.                           - Normal esophagus.                           - Gastritis. Biopsied.                           - Normal examined duodenum.                           - The BRAVO pH capsule was deployed. Recommendation:           - Patient has a contact number available for                            emergencies. The signs and symptoms  of potential                            delayed complications were discussed with the                            patient. Return to normal activities tomorrow.                            Written discharge instructions were provided to the                            patient.                           - Resume previous diet.                           -  Continue present medications.                           - Await pathology results.                           - Avoid PPI, H2 blocker and antacids for next 48                            hours                           - Follow up 48 hour pH Bravo results                           - Return to GI office at the next available                            appointment. Please call to schedule appointment Napoleon Form, MD 07/06/2022 10:33:58 AM This report has been signed electronically.

## 2022-07-06 NOTE — Progress Notes (Signed)
Pt's states no medical or surgical changes since previsit or office visit. 

## 2022-07-06 NOTE — Patient Instructions (Signed)
**   Bravo diary given **  YOU HAD AN ENDOSCOPIC PROCEDURE TODAY AT THE [facility]:   Refer to the procedure report that was given to you for any specific questions about what was found during the examination.  If the procedure report does not answer your questions, please call your gastroenterologist to clarify.  If you requested that your care partner not be given the details of your procedure findings, then the procedure report has been included in a sealed envelope for you to review at your convenience later.  YOU SHOULD EXPECT: Some feelings of bloating in the abdomen. Passage of more gas than usual.  Walking can help get rid of the air that was put into your GI tract during the procedure and reduce the bloating. If you had a lower endoscopy (such as a colonoscopy or flexible sigmoidoscopy) you may notice spotting of blood in your stool or on the toilet paper. If you underwent a bowel prep for your procedure, you may not have a normal bowel movement for a few days.  Please Note:  You might notice some irritation and congestion in your nose or some drainage.  This is from the oxygen used during your procedure.  There is no need for concern and it should clear up in a day or so.  SYMPTOMS TO REPORT IMMEDIATELY:  Following upper endoscopy (EGD)  Vomiting of blood or coffee ground material  New chest pain or pain under the shoulder blades  Painful or persistently difficult swallowing  New shortness of breath  Fever of 100F or higher  Black, tarry-looking stools  For urgent or emergent issues, a gastroenterologist can be reached at any hour by calling [phone]. Do not use MyChart messaging for urgent concerns.    DIET:  We do recommend a small meal at first, but then you may proceed to your regular diet.  Drink plenty of fluids but you should avoid alcoholic beverages for 24 hours.  ACTIVITY:  You should plan to take it easy for the rest of today and you should NOT DRIVE or  use heavy machinery until tomorrow (because of the sedation medicines used during the test).    FOLLOW UP: Our staff will call the number listed on your records the next business day following your procedure.  We will call around 7:15- 8:00 am to check on you and address any questions or concerns that you may have regarding the information given to you following your procedure. If we do not reach you, we will leave a message.  If you develop any symptoms (ie: fever, flu-like symptoms, shortness of breath, cough etc.) before then, please call [phone].  If you test positive for Covid 19 in the 2 weeks post procedure, please call and report this information to [place].    If any biopsies were taken you will be contacted by phone or by letter within the next 1-3 weeks.  Please call us at [phone] if you have not heard about the biopsies in 3 weeks.    SIGNATURES/CONFIDENTIALITY: You and/or your care partner have signed paperwork which will be entered into your electronic medical record.  These signatures attest to the fact that that the information above on your After Visit Summary has been reviewed and is understood.  Full responsibility of the confidentiality of this discharge information lies with you and/or your care-partner.

## 2022-07-06 NOTE — Progress Notes (Signed)
To pacu, VSS. Report to [email or web address].tb 

## 2022-07-07 ENCOUNTER — Telehealth: Payer: Self-pay | Admitting: *Deleted

## 2022-07-07 MED ORDER — LIDOCAINE VISCOUS HCL 2 % MT SOLN: 5.0000 mL | OROMUCOSAL | 0 refills | Status: DC | PRN

## 2022-07-07 NOTE — Telephone Encounter (Signed)
She has hypersensitivity, may be feeling the Bravo sensor. I discussed in detail with patient yesterday prior to Bravo placement if she wanted to proceed . She agreed to proceed and was aware of all the potential side effects and symptoms.   Its fine to advance diet as tolerated. I called in Rx for viscous lidocaine 5cc every 4-6 hrs as needed for swish and swallow. Please advise her to use it as needed

## 2022-07-07 NOTE — Telephone Encounter (Signed)
  Follow up Call-     07/06/2022    8:54 AM  Call back number  Post procedure Call Back phone  # [phone]  Permission to leave phone message Yes     Patient questions:  Do you have a fever, pain , or abdominal swelling? No. Pain Score  0 *  Have you tolerated food without any problems? No.  Have you been able to return to your normal activities? Yes.    Do you have any questions about your discharge instructions: Diet   No. Medications  No. Follow up visit  No.  Do you have questions or concerns about your Care? No.  Actions: * If pain score is 4 or above: No action needed, pain <4. On f/u call pt says throat and chest a little sore on right side from placement of Bravo. Pt says she is mostly drinking liquids because hurts throat a little to eat soft foods and hurts more to eat solid food.

## 2022-07-12 ENCOUNTER — Telehealth: Payer: Self-pay | Admitting: Gastroenterology

## 2022-07-12 NOTE — Telephone Encounter (Signed)
Patient called. Requesting EGD result.

## 2022-07-13 ENCOUNTER — Telehealth: Payer: Self-pay | Admitting: Gastroenterology

## 2022-07-13 ENCOUNTER — Encounter: Payer: Self-pay | Admitting: Gastroenterology

## 2022-07-13 NOTE — Telephone Encounter (Signed)
Spoke with the patient. She has many questions and concerns. Encouraged her to wait for the provider recommendations after careful review of her health history and the test results.

## 2022-07-13 NOTE — Telephone Encounter (Signed)
Called the patient back. No answer. Left a voicemail. Canceled the appointment as requested. Moved to the next opening 09/16/22 at 8:30 am.

## 2022-07-13 NOTE — Telephone Encounter (Signed)
Inbound call from patient stating that the nurse had called her about making an appointment on the 24th of August. Patient stated that she will be flying out of town that day and wont be able to come. Patient is requesting a call back to discuss. Please advise.

## 2022-07-14 ENCOUNTER — Telehealth: Payer: Self-pay | Admitting: Gastroenterology

## 2022-07-14 NOTE — Telephone Encounter (Signed)
Spoke with the patient. Confirmed she is not having an MRI. Explained the reason she cannot have an MRI. Patient is okay to have imaging otherwise.

## 2022-07-16 DIAGNOSIS — M5451 Vertebrogenic low back pain: Secondary | ICD-10-CM | POA: Diagnosis not present

## 2022-07-16 DIAGNOSIS — M419 Scoliosis, unspecified: Secondary | ICD-10-CM | POA: Diagnosis not present

## 2022-07-19 ENCOUNTER — Encounter: Payer: Self-pay | Admitting: Family Medicine

## 2022-07-19 ENCOUNTER — Ambulatory Visit: Payer: 59 | Admitting: Family Medicine

## 2022-07-19 VITALS — BP 138/88 | HR 94 | Temp 98.2°F | Resp 12 | Ht 59.0 in | Wt 94.0 lb

## 2022-07-19 DIAGNOSIS — G4700 Insomnia, unspecified: Secondary | ICD-10-CM

## 2022-07-19 DIAGNOSIS — K2940 Chronic atrophic gastritis without bleeding: Secondary | ICD-10-CM

## 2022-07-19 DIAGNOSIS — I10 Essential (primary) hypertension: Secondary | ICD-10-CM

## 2022-07-19 DIAGNOSIS — M7711 Lateral epicondylitis, right elbow: Secondary | ICD-10-CM | POA: Diagnosis not present

## 2022-07-19 DIAGNOSIS — M79605 Pain in left leg: Secondary | ICD-10-CM

## 2022-07-19 DIAGNOSIS — M79604 Pain in right leg: Secondary | ICD-10-CM

## 2022-07-19 MED ORDER — LOSARTAN POTASSIUM 25 MG PO TABS: 25.0000 mg | ORAL_TABLET | Freq: Every day | ORAL | 0 refills | Status: DC

## 2022-07-19 MED ORDER — SUCRALFATE 1 G PO TABS: 1.0000 g | ORAL_TABLET | Freq: Three times a day (TID) | ORAL | 2 refills | Status: DC

## 2022-07-19 NOTE — Progress Notes (Signed)
ACUTE VISIT Chief Complaint  Patient presents with   Arm Pain    Right arm, x 3-4 weeks.   Hypertension    Having high BP readings at home   HPI: [name] is a 56 y.o. right-handed female with history of hypothyroidism, hyperlipidemia, GERD, DM 2, and chronic back pain here today with above concerns.  Right elbow pain, around the lateral aspect, intermittent, achy like pain, 6/10.  It is exacerbated by lifting, sleeping on right side, palpation, and movement. Noted some edema yesterday, better today. No erythema. She tried ice band.  Problem is stable.  No history of trauma but a couple days before pain started she increased weight lifting from 3 pounds to 5 pounds. Pain is radiated to proximal aspect of right forearm. Negative for new numbness and tingling. No associated neck pain.  Hypertension: She is also concerned about elevated BPs: 138/85, 152/71, 141/92, 148/88, and 140/80. Negative for CP, dyspnea, decreased urine output, gross hematuria, foamy urine, or lower extremity edema.  Lab Results  Component Value Date   CREATININE 0.67 06/29/2022   BUN 10 06/29/2022   NA 138 06/29/2022   K 4.1 06/29/2022   CL 103 06/29/2022   CO2 29 06/29/2022   She was last seen on 07/02/22, when Ambien CR 12.5 mg was started to treat insomnia.  She has tolerated medication well, she has been able to sleep at least 5 hours and feels rested the next day.  She has not noticed side effects.  Concerned about EGD and pathology results. She thinks she may need an "autoimmune" disorder work up.  States that she has tried to reach her gastroenterologist, she has an appt in 08/2022.  She is not taking Protonix or Pepcid, they seem to aggravate symptoms.. In the past she tried sucralfate, which seemed to help with heartburn and reflux. EGD on 07/06/22: Z-line regular, 36 cm from the incisors. - Normal esophagus. - Gastritis. Biopsied. - Normal examined duodenum.  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 - HELICOBACTER PYLORI IMMUNOHISTOCHEMICAL (IHC) STAIN NEGATIVE FOR ORGANISMS, WITH ADEQUATE CONTROLS. - CHROMOGRANIN IHC STAIN WITH ADEQUATE CONTROLS. - NEGATIVE FOR MALIGNANCY.  -Calves dull pain for a few weeks different to her usual pain. Exacerbated by walking. She is also reporting fatigue, worse with exercise. Negative for joint edema or erythema.  Review of Systems  Constitutional:  Negative for activity change, appetite change and fever.  HENT:  Negative for mouth sores, nosebleeds and sore throat.   Eyes:  Negative for redness and visual disturbance.  Respiratory:  Negative for cough and wheezing.   Cardiovascular:  Negative for chest pain, palpitations and leg swelling.  Gastrointestinal:  Positive for nausea (chronic). Negative for abdominal pain and vomiting.       Negative for changes in bowel habits.  Endocrine: Negative for cold intolerance and heat intolerance.  Musculoskeletal:  Positive for arthralgias, back pain and myalgias. Negative for gait problem.  Skin:  Negative for rash.  Neurological:  Positive for dizziness, numbness (+ tingling and burining sensation of extremiteis.Chronic) and headaches (chronic). Negative for syncope and weakness.  Hematological:  Negative for adenopathy. Does not bruise/bleed easily.  Psychiatric/Behavioral:  Negative for confusion. The patient is nervous/anxious.   Rest see pertinent positives and negatives per HPI.  Current Outpatient Medications on File Prior to Visit  Medication Sig Dispense Refill   Blood Glucose Monitoring  Suppl (ACCU-CHEK GUIDE) w/Device KIT Use to test blood sugars 1-2 times daily. 1 kit 0   Calcium Carb-Cholecalciferol (CALCIUM 1000 + D) 1000-800 MG-UNIT TABS Take 1 tablet by mouth as needed.     cholecalciferol (VITAMIN  D3) 25 MCG (1000 UNIT) tablet Take 1 tablet by mouth. Three times a week as needed     cyanocobalamin (,VITAMIN B-12,) 1000 MCG/ML injection Inject 1,000 mcg into the muscle. twice a month     estradiol (ESTRACE) 1 MG tablet Take 0.5 mg by mouth daily.     gabapentin (NEURONTIN) 300 MG capsule Take 300 mg by mouth at bedtime.     levothyroxine (SYNTHROID) 88 MCG tablet Take 1 tablet (88 mcg total) by mouth daily. 90 tablet 0   pravastatin (PRAVACHOL) 20 MG tablet Take 1 tablet (20 mg total) by mouth daily. 90 tablet 2   zolpidem (AMBIEN CR) 12.5 MG CR tablet Take 1 tablet (12.5 mg total) by mouth at bedtime as needed for sleep. 90 tablet 0   pantoprazole (PROTONIX) 40 MG tablet Take 40 mg by mouth 2 (two) times daily.     No current facility-administered medications on file prior to visit.   Past Medical History:  Diagnosis Date   Fibromyalgia    GERD (gastroesophageal reflux disease)    HLD (hyperlipidemia)    Hypoglycemia    Hypothyroidism    Insomnia    Prediabetes    Allergies  Allergen Reactions   Lexapro [Escitalopram] Itching   Other     Patient does not want any narcotics- makes her itch.   Soma [Carisoprodol] Itching   Tramadol Itching    Ultram   Social History   Socioeconomic History   Marital status: Single    Spouse name: Not on file   Number of children: 0   Years of education: Not on file   Highest education level: Not on file  Occupational History   Occupation: retired  Tobacco Use   Smoking status: Never   Smokeless tobacco: Never  Vaping Use   Vaping Use: Never used  Substance and Sexual Activity   Alcohol use: Not Currently   Drug use: Never   Sexual activity: Not on file  Other Topics Concern   Not on file  Social History Narrative   Not on file   Social Determinants of Health   Financial Resource Strain: Not on file  Food Insecurity: Not on file  Transportation Needs: Not on file  Physical Activity: Not on file  Stress: Not on file   Social Connections: Not on file   Vitals:   07/19/22 0700  BP: 138/88  Pulse: 94  Resp: 12  Temp: 98.2 F (36.8 C)  SpO2: 99%   Body mass index is 18.99 kg/m.  Physical Exam Vitals and nursing note reviewed.  Constitutional:      General: She is not in acute distress.    Appearance: She is well-developed.  HENT:     Head: Normocephalic and atraumatic.     Mouth/Throat:     Mouth: Mucous membranes are moist.     Pharynx: Oropharynx is clear.  Eyes:     Conjunctiva/sclera: Conjunctivae normal.  Cardiovascular:     Rate and Rhythm: Normal rate and regular rhythm.     Pulses:          Dorsalis pedis pulses are 2+ on the right side and 2+ on the left side.     Heart sounds: No murmur heard.    Comments: Calves  pain is not exacerbated by foot dorsiflexion. Pulmonary:     Effort: Pulmonary effort is normal. No respiratory distress.     Breath sounds: Normal breath sounds.  Abdominal:     Palpations: Abdomen is soft. There is no hepatomegaly or mass.     Tenderness: There is no abdominal tenderness.  Musculoskeletal:     Right elbow: No deformity or effusion. Normal range of motion. Tenderness present in lateral epicondyle.     Right lower leg: No edema.     Left lower leg: No edema.     Comments: Tenderness upon palpation around right lateral epicondyle. Pain exacerbated by supination and pronation with resistance. I do not appreciate erythema or edema.  Lymphadenopathy:     Cervical: No cervical adenopathy.  Skin:    General: Skin is warm.     Findings: No erythema or rash.  Neurological:     General: No focal deficit present.     Mental Status: She is alert and oriented to person, place, and time.     Cranial Nerves: No cranial nerve deficit.     Gait: Gait normal.  Psychiatric:        Mood and Affect: Mood is anxious.     Comments: Well groomed, good eye contact.   ASSESSMENT AND PLAN:  Ms.Wyllow was seen today for arm pain and hypertension.  Diagnoses and  all orders for this visit: Orders Placed This Encounter  Procedures   Basic metabolic panel   Epicondylitis, lateral, right Discussed diagnosis, prognosis, and treatment options. PT will be arranged. Local ice, IcyHot on affected area may help. Avoid heavy lifting.  Atrophic gastritis without hemorrhage We reviewed EGD and pathology report. Sucralfate has helped in the past, she would like to try again. Continue GERD precautions. Keep appointment with gastro in 08/2022.  -     sucralfate (CARAFATE) 1 g tablet; Take 1 tablet (1 g total) by mouth 4 (four) times daily -  with meals and at bedtime.  Pain in both lower extremities We discussed possible etiologies. History and examination today do not suggest a serious process. Some of her chronic problems could be contributing factors: Fibromyalgia, vein disease,radiculopathy pain among some. Compression stockings may help. Instructed about warning signs.  Insomnia Problem has improved with Ambien CR 12.5 mg, so continue daily at bedtime as needed. Adequate sleep hygiene also to continue.  Essential (primary) hypertension We discussed a few pharmacologic treatment options, she would like to try losartan. Side effect discussed. Losartan 25 mg daily started today. Continue low-salt/DASH diet. Continue monitoring BP regularly. BMP in 7 to 10 days. Follow-up in 4 weeks.  I spent a total of 54 minutes in both face to face and non face to face activities for this visit on the date of this encounter. During this time history was obtained and documented, examination was performed, prior labs/imaging reviewed, and assessment/plan discussed.  Return in about 4 weeks (around 08/16/2022).  Sophira Rumler G. [place], MD  [facility]. [place] office.

## 2022-07-19 NOTE — Assessment & Plan Note (Signed)
Problem has improved with Ambien CR 12.5 mg, so continue daily at bedtime as needed. Adequate sleep hygiene also to continue.

## 2022-07-19 NOTE — Patient Instructions (Addendum)
A few things to remember from today's visit:  Epicondylitis, lateral, right  Atrophic gastritis without hemorrhage - Plan: sucralfate (CARAFATE) 1 g tablet  Essential (primary) hypertension - Plan: losartan (COZAAR) 25 MG tablet, Basic metabolic panel  Insomnia, unspecified type  Pain in both lower extremities  If you need refills please call your pharmacy. Do not use My Chart to request refills or for acute issues that need immediate attention.   Sucralfate before meals and at bedtime and Losartan 25 mg added today. Labs in 7-10 days. PT will be arranged for elbow pain. Topical icy hot or asper cream may help. Keep appt with your gastroenterologist.  Please be sure medication list is accurate. If a new problem present, please set up appointment sooner than planned today. Tennis Elbow  Tennis elbow (lateral epicondylitis) is inflammation of tendons in your outer forearm, near your elbow. Tendons are tissues that connect muscle to bone. When you have tennis elbow, inflammation affects the tendons that you use to bend your wrist and move your hand up. Inflammation occurs in the lower part of the upper arm bone (humerus), where the tendons connect to the bone (lateral epicondyle). Tennis elbow often affects people who play tennis, but anyone may get the condition from repeatedly extending the wrist or turning the forearm. What are the causes? This condition is usually caused by repeatedly extending the wrist, turning the forearm, and using the hands. It can result from sports or work that requires repetitive forearm movements. In some cases, it may be caused by a sudden injury. What increases the risk? You are more likely to develop tennis elbow if you play tennis or another racket sport. You also have a higher risk if you frequently use your hands for work. Besides people who play tennis, others at greater risk include: People who use computers. Holiday representative workers. People who work in  [employer]. Musicians. Cooks. Cashiers. What are the signs or symptoms? Symptoms of this condition include: Pain and tenderness in the forearm and the outer part of the elbow. Pain may be felt only when using the arm, or it may be there all the time. A burning feeling that starts in the elbow and spreads down the forearm. A weak grip in the hand. How is this diagnosed? This condition is diagnosed based on your symptoms, your medical history, and a physical exam. You may also have X-rays or an MRI to: Confirm the diagnosis. Look for other issues. Check for tears in the ligaments, muscles, or tendons. How is this treated? Resting and icing your arm is often the first treatment. Your health care provider may also recommend: Medicines to reduce pain and inflammation. These may be in the form of a pill, topical gels, or shots of a steroid medicine (cortisone). An elbow strap to reduce stress on the area. Physical therapy. This may include massage or exercises or both. An elbow brace to restrict the movements that cause symptoms. If these treatments do not help relieve your symptoms, your health care provider may recommend surgery to remove damaged muscle and reattach healthy muscle to bone. Follow these instructions at home: If you have a brace or strap: Wear the brace or strap as told by your health care provider. Remove it only as told by your health care provider. Check the skin around the brace or strap every day. Tell your health care provider about any concerns. Loosen the brace if your fingers tingle, become numb, or turn cold and blue. Keep the brace clean.  If the brace or strap is not waterproof: Do not let it get wet. Cover it with a watertight covering when you take a bath or a shower. Managing pain, stiffness, and swelling  If directed, put ice on the injured area. To do this: If you have a removable brace or strap, remove it as told by your health care provider. Put ice in a  plastic bag. Place a towel between your skin and the bag. Leave the ice on for 20 minutes, 2-3 times a day. Remove the ice if your skin turns bright red. This is very important. If you cannot feel pain, heat, or cold, you have a greater risk of damage to the area. Move your fingers often to reduce stiffness and swelling. Activity Rest your elbow and wrist and avoid activities that cause symptoms as told by your health care provider. Do physical therapy exercises as told by your health care provider. If you lift an object, lift it with your palm facing up. This reduces stress on your elbow. Lifestyle If your tennis elbow is caused by sports, check your equipment and make sure that: You use it correctly. It is good match for you. If your tennis elbow is caused by work or computer use, take frequent breaks to stretch your arm. Talk with your employer about ways to manage your condition at work. General instructions Take over-the-counter and prescription medicines only as told by your health care provider. Do not use any products that contain nicotine or tobacco. These products include cigarettes, chewing tobacco, and vaping devices, such as e-cigarettes. If you need help quitting, ask your health care provider. Keep all follow-up visits. This is important. How is this prevented? Before and after activity: Warm up and stretch before being active. Cool down and stretch after being active. Give your body time to rest between periods of activity. During activity: Make sure to use equipment that fits you. If you play tennis, put power in your stroke with your lower body. Avoid using your arm only. Maintain physical fitness, including: Strength. Flexibility. Endurance. Do exercises to strengthen the forearm muscles. Contact a health care provider if: You have pain that gets worse or does not get better with treatment. You have numbness or weakness in your forearm, hand, or fingers. Get help  right away if: Your pain is severe. You cannot move your wrist. Summary Tennis elbow (lateral epicondylitis) is inflammation of tendons in your outer forearm, near your elbow. Common symptoms include pain and tenderness in your forearm and the outer part of your elbow. This condition is usually caused by repeatedly extending your wrist, turning your forearm, and using your hands. The first treatment is often resting and icing your arm to relieve symptoms. Further treatment may include taking medicine, getting physical therapy, wearing a brace or strap, or having surgery. This information is not intended to replace advice given to you by your health care provider. Make sure you discuss any questions you have with your health care provider. Document Revised: 06/17/2020 Document Reviewed: 06/17/2020 Elsevier Patient Education  2023 [employer].

## 2022-07-19 NOTE — Assessment & Plan Note (Signed)
We discussed a few pharmacologic treatment options, she would like to try losartan. Side effect discussed. Losartan 25 mg daily started today. Continue low-salt/DASH diet. Continue monitoring BP regularly. BMP in 7 to 10 days. Follow-up in 4 weeks.

## 2022-07-29 ENCOUNTER — Other Ambulatory Visit: Payer: 59

## 2022-08-01 ENCOUNTER — Encounter: Payer: Self-pay | Admitting: Family Medicine

## 2022-08-02 ENCOUNTER — Other Ambulatory Visit: Payer: 59

## 2022-08-03 NOTE — Telephone Encounter (Signed)
Called pt for clarification.  Also reviewed notes. Appt for 08/25/22 is a BP f/u only & only BMP needed according to MD notes. Pt requests to have BMP, TSH, B12, & CBC only despite letting her know that she only needs BMP tomorrow.

## 2022-08-04 ENCOUNTER — Other Ambulatory Visit (INDEPENDENT_AMBULATORY_CARE_PROVIDER_SITE_OTHER): Payer: 59

## 2022-08-04 DIAGNOSIS — E538 Deficiency of other specified B group vitamins: Secondary | ICD-10-CM

## 2022-08-04 DIAGNOSIS — D509 Iron deficiency anemia, unspecified: Secondary | ICD-10-CM | POA: Diagnosis not present

## 2022-08-04 DIAGNOSIS — E038 Other specified hypothyroidism: Secondary | ICD-10-CM

## 2022-08-04 DIAGNOSIS — E063 Autoimmune thyroiditis: Secondary | ICD-10-CM | POA: Diagnosis not present

## 2022-08-04 DIAGNOSIS — I10 Essential (primary) hypertension: Secondary | ICD-10-CM

## 2022-08-04 LAB — BASIC METABOLIC PANEL
BUN: 11 mg/dL (ref 6–23)
CO2: 27 mEq/L (ref 19–32)
Calcium: 9.1 mg/dL (ref 8.4–10.5)
Chloride: 103 mEq/L (ref 96–112)
Creatinine, Ser: 0.59 mg/dL (ref 0.40–1.20)
GFR: 101.34 mL/min (ref >60.00)
Glucose, Bld: 123 mg/dL — ABNORMAL HIGH (ref 70–99)
Potassium: 3.9 mEq/L (ref 3.5–5.1)
Sodium: 137 mEq/L (ref 135–145)

## 2022-08-04 LAB — CBC WITH DIFFERENTIAL/PLATELET
Basophils Absolute: 0 10*3/uL (ref 0.0–0.1)
Basophils Relative: 0.6 % (ref 0.0–3.0)
Eosinophils Absolute: 0 10*3/uL (ref 0.0–0.7)
Eosinophils Relative: 0.6 % (ref 0.0–5.0)
HCT: 36.5 % (ref 36.0–46.0)
Hemoglobin: 11.8 g/dL — ABNORMAL LOW (ref 12.0–15.0)
Lymphocytes Relative: 28.8 % (ref 12.0–46.0)
Lymphs Abs: 1.4 10*3/uL (ref 0.7–4.0)
MCHC: 32.3 g/dL (ref 30.0–36.0)
MCV: 87.6 fl (ref 78.0–100.0)
Monocytes Absolute: 0.6 10*3/uL (ref 0.1–1.0)
Monocytes Relative: 12.2 % — ABNORMAL HIGH (ref 3.0–12.0)
Neutro Abs: 2.9 10*3/uL (ref 1.4–7.7)
Neutrophils Relative %: 57.8 % (ref 43.0–77.0)
Platelets: 253 10*3/uL (ref 150.0–400.0)
RBC: 4.17 Mil/uL (ref 3.87–5.11)
RDW: 13.4 % (ref 11.5–15.5)
WBC: 4.9 10*3/uL (ref 4.0–10.5)

## 2022-08-04 LAB — VITAMIN B12: Vitamin B-12: 357 pg/mL (ref 211–911)

## 2022-08-04 LAB — TSH: TSH: 0.14 u[IU]/mL — ABNORMAL LOW (ref 0.35–5.50)

## 2022-08-06 ENCOUNTER — Encounter: Payer: Self-pay | Admitting: Family Medicine

## 2022-08-10 ENCOUNTER — Encounter: Payer: Self-pay | Admitting: Family Medicine

## 2022-08-11 NOTE — Telephone Encounter (Signed)
Pt leaving on a cruise tomorrow and would like guidance prior to her departure.

## 2022-08-12 ENCOUNTER — Other Ambulatory Visit: Payer: 59

## 2022-08-12 ENCOUNTER — Ambulatory Visit: Payer: 59 | Admitting: Gastroenterology

## 2022-08-16 NOTE — Telephone Encounter (Signed)
See other encounter.

## 2022-08-24 ENCOUNTER — Telehealth: Payer: Self-pay | Admitting: Family Medicine

## 2022-08-24 ENCOUNTER — Encounter: Payer: Self-pay | Admitting: Family Medicine

## 2022-08-24 ENCOUNTER — Telehealth (INDEPENDENT_AMBULATORY_CARE_PROVIDER_SITE_OTHER): Payer: 59 | Admitting: Family Medicine

## 2022-08-24 VITALS — Ht 59.0 in

## 2022-08-24 DIAGNOSIS — U071 COVID-19: Secondary | ICD-10-CM | POA: Diagnosis not present

## 2022-08-24 DIAGNOSIS — E063 Autoimmune thyroiditis: Secondary | ICD-10-CM

## 2022-08-24 DIAGNOSIS — I10 Essential (primary) hypertension: Secondary | ICD-10-CM | POA: Diagnosis not present

## 2022-08-24 DIAGNOSIS — E038 Other specified hypothyroidism: Secondary | ICD-10-CM

## 2022-08-24 DIAGNOSIS — E538 Deficiency of other specified B group vitamins: Secondary | ICD-10-CM | POA: Diagnosis not present

## 2022-08-24 DIAGNOSIS — H109 Unspecified conjunctivitis: Secondary | ICD-10-CM

## 2022-08-24 MED ORDER — MOLNUPIRAVIR EUA 200MG CAPSULE: 4.0000 | ORAL_CAPSULE | Freq: Two times a day (BID) | ORAL | 0 refills | Status: AC

## 2022-08-24 MED ORDER — PREDNISONE 20 MG PO TABS: 40.0000 mg | ORAL_TABLET | Freq: Every day | ORAL | 0 refills | Status: AC

## 2022-08-24 MED ORDER — LOSARTAN POTASSIUM 50 MG PO TABS: 50.0000 mg | ORAL_TABLET | Freq: Every day | ORAL | 1 refills | Status: DC

## 2022-08-24 MED ORDER — BENZONATATE 100 MG PO CAPS: 100.0000 mg | ORAL_CAPSULE | Freq: Two times a day (BID) | ORAL | 0 refills | Status: AC | PRN

## 2022-08-24 MED ORDER — ALBUTEROL SULFATE HFA 108 (90 BASE) MCG/ACT IN AERS: 2.0000 | INHALATION_SPRAY | Freq: Four times a day (QID) | RESPIRATORY_TRACT | 0 refills | Status: DC | PRN

## 2022-08-24 MED ORDER — ERYTHROMYCIN 5 MG/GM OP OINT: 1.0000 | TOPICAL_OINTMENT | Freq: Every day | OPHTHALMIC | 0 refills | Status: AC

## 2022-08-24 NOTE — Progress Notes (Signed)
Virtual Visit via Video Note I connected with [name] on 08/24/22 by a video enabled telemedicine application and verified that I am speaking with the correct person using two identifiers.  Location patient: home Location provider:work office Persons participating in the virtual visit: patient, provider  I discussed the limitations of evaluation and management by telemedicine and the availability of in person appointments. The patient expressed understanding and agreed to proceed.  Chief Complaint  Patient presents with   Covid Positive   History of Present Illness: [name] is a 56 y.o.female with hx of hypothyroidism,HTN,DM II, insomnia, and fibromyalgia reporting positive COVID 19 home test yesterday. She has been symptomatic for 5 days today, symptoms started while she was in a cruise. Subjective fever,fatigue,yellowish-pink rhinorrhea,nasal congestion, post nasal drainage, sore throat, chest congestion, productive cough, wheezing,and body aches. Mucinex CM has helped with cough, she still would like a Rx for something for cough. She has also taken Tylenol.  Nausea just when she takes OTC cough medication. Decreased taste yesterday, feeling better today. Had diarrhea a couple times, resolved.  Whitish eye crusty areas around eyes and eye pruritus + conjunctival erythema. She started erythromycin oint and has helped, states that it was expired, would like a refill. Negative for visual changes.  Denies CP,SOB,hemoptysis,abdominal pain,urinary symptoms,or skin rash. O2 sats 97-98% RA. Her parents were also sick and dx'ed with COVID 19 infection.  B12 deficiency: She has not been on B12 injections for at least 6 months. She was on B12 1000 mcg q 2 weeks.  Lab Results  Component Value Date   [id number] 08/04/2022   Inquiring about TSH results.  She did not get message. She is on Synthroid 88 mcg daily.  Lab Results  Component Value Date   TSH 0.14 (L)  08/04/2022   Hypertension: Concerned about elevated BP's for the apst few weeks. 130-140's/80-90's. She is on Losartan 25 mg daily. Negative for unusual or severe headache, focal weakness, or edema.  Lab Results  Component Value Date   CREATININE 0.59 08/04/2022   BUN 11 08/04/2022   NA 137 08/04/2022   K 3.9 08/04/2022   CL 103 08/04/2022   CO2 27 08/04/2022   ROS: See pertinent positives and negatives per HPI.  Past Medical History:  Diagnosis Date   Fibromyalgia    GERD (gastroesophageal reflux disease)    HLD (hyperlipidemia)    Hypoglycemia    Hypothyroidism    Insomnia    Prediabetes    Past Surgical History:  Procedure Laterality Date   APPENDECTOMY     FACIAL RECONSTRUCTION SURGERY     LAPAROTOMY     UTERINE FIBROID SURGERY     VAGINAL HYSTERECTOMY     complete   Family History  Problem Relation Age of Onset   Diabetes Mother    Hypertension Mother    Heart failure Mother    Irritable bowel syndrome Mother    Hypothyroidism Mother    Atrial fibrillation Mother    Hyperlipidemia Mother    Hypertension Father    Heart disease Father    Hyperlipidemia Father    Diabetes Maternal Grandmother    Hypertension Maternal Grandmother    Hypertension Maternal Grandfather    Hypertension Paternal Grandmother    Hypertension Paternal Grandfather    Social History   Socioeconomic History   Marital status: Single    Spouse name: Not on file   Number of children: 0   Years of education: Not on file  Highest education level: Not on file  Occupational History   Occupation: retired  Tobacco Use   Smoking status: Never   Smokeless tobacco: Never  Vaping Use   Vaping Use: Never used  Substance and Sexual Activity   Alcohol use: Not Currently   Drug use: Never   Sexual activity: Not on file  Other Topics Concern   Not on file  Social History Narrative   Not on file   Social Determinants of Health   Financial Resource Strain: Not on file  Food  Insecurity: Not on file  Transportation Needs: Not on file  Physical Activity: Not on file  Stress: Not on file  Social Connections: Not on file  Intimate Partner Violence: Not on file    Current Outpatient Medications:    albuterol (VENTOLIN HFA) 108 (90 Base) MCG/ACT inhaler, Inhale 2 puffs into the lungs every 6 (six) hours as needed for wheezing or shortness of breath., Disp: 8 g, Rfl: 0   benzonatate (TESSALON) 100 MG capsule, Take 1 capsule (100 mg total) by mouth 2 (two) times daily as needed for up to 10 days., Disp: 20 capsule, Rfl: 0   Blood Glucose Monitoring Suppl (ACCU-CHEK GUIDE) w/Device KIT, Use to test blood sugars 1-2 times daily., Disp: 1 kit, Rfl: 0   Calcium Carb-Cholecalciferol (CALCIUM 1000 + D) 1000-800 MG-UNIT TABS, Take 1 tablet by mouth as needed., Disp: , Rfl:    cholecalciferol (VITAMIN D3) 25 MCG (1000 UNIT) tablet, Take 1 tablet by mouth. Three times a week as needed, Disp: , Rfl:    cyanocobalamin (,VITAMIN B-12,) 1000 MCG/ML injection, Inject 1,000 mcg into the muscle. twice a month, Disp: , Rfl:    erythromycin ophthalmic ointment, Place 1 Application into the left eye at bedtime for 7 days., Disp: 7 g, Rfl: 0   estradiol (ESTRACE) 1 MG tablet, Take 0.5 mg by mouth daily., Disp: , Rfl:    gabapentin (NEURONTIN) 300 MG capsule, Take 300 mg by mouth at bedtime., Disp: , Rfl:    levothyroxine (SYNTHROID) 88 MCG tablet, Take 1 tablet (88 mcg total) by mouth daily., Disp: 90 tablet, Rfl: 0   losartan (COZAAR) 50 MG tablet, Take 1 tablet (50 mg total) by mouth daily., Disp: 90 tablet, Rfl: 1   molnupiravir EUA (LAGEVRIO) 200 mg CAPS capsule, Take 4 capsules (800 mg total) by mouth 2 (two) times daily for 5 days., Disp: 40 capsule, Rfl: 0   pantoprazole (PROTONIX) 40 MG tablet, Take 40 mg by mouth 2 (two) times daily., Disp: , Rfl:    pravastatin (PRAVACHOL) 20 MG tablet, Take 1 tablet (20 mg total) by mouth daily., Disp: 90 tablet, Rfl: 2   predniSONE (DELTASONE) 20  MG tablet, Take 2 tablets (40 mg total) by mouth daily with breakfast for 5 days., Disp: 10 tablet, Rfl: 0   sucralfate (CARAFATE) 1 g tablet, Take 1 tablet (1 g total) by mouth 4 (four) times daily -  with meals and at bedtime., Disp: 120 tablet, Rfl: 2   zolpidem (AMBIEN CR) 12.5 MG CR tablet, Take 1 tablet (12.5 mg total) by mouth at bedtime as needed for sleep., Disp: 90 tablet, Rfl: 0  EXAM:  VITALS per patient if applicable:Ht '4\' 11"'  (1.499 m)   SpO2 98%   BMI 18.99 kg/m   GENERAL: alert, oriented, appears well and in no acute distress  HEENT: atraumatic, conjunctiva clear, I do not appreciate drainage. No obvious abnormalities on inspection of external nose and ears  NECK: normal  movements of the head and neck  LUNGS: on inspection no signs of respiratory distress, breathing rate appears normal, no obvious gross SOB, gasping or wheezing. Non productive cough a few times during visit.  CV: no obvious cyanosis  MS: moves all visible extremities without noticeable abnormality  PSYCH/NEURO: pleasant and cooperative, no obvious depression or anxiety, speech and thought processing grossly intact  ASSESSMENT AND PLAN:  Discussed the following assessment and plan:  1. COVID-19 virus infection We discussed Dx,possible complications and treatment options. She has a mild to moderate case with risk for complications. We discussed oral antiviral options and side effects, today is day 5th, so if she needs to start medication today. She agrees with trying Molnupiravir. Symptomatic treatment with plenty of fluids,rest,tylenol 500 mg 3-4 times per day prn. Benzonatate 100 mg bid prn for cough. She is reporting some wheezing, so recommend Prednisone 40 mg x 3-5 days with breakfast. Albuterol inh 2 puff every 6 hours for a week then as needed for wheezing or shortness of breath.   Explained that cough and congestion may last a few more days and even weeks after acute symptoms have  resolved. Clearly instructed about warning signs.  - molnupiravir EUA (LAGEVRIO) 200 mg CAPS capsule; Take 4 capsules (800 mg total) by mouth 2 (two) times daily for 5 days.  Dispense: 40 capsule; Refill: 0 - benzonatate (TESSALON) 100 MG capsule; Take 1 capsule (100 mg total) by mouth 2 (two) times daily as needed for up to 10 days.  Dispense: 20 capsule; Refill: 0  Conjunctivitis of both eyes, unspecified conjunctivitis type Explained that it is most likely viral. Hx and observation do not suggest bacterial infection. She still would like a refill of Erythromycin oint.  - erythromycin ophthalmic ointment; Place 1 Application into the left eye at bedtime for 7 days.  Dispense: 7 g; Refill: 0  B12 deficiency Last B12 in 07/2022 was 327, she has not had B12 injections in 6 months. We discussed options, she prefers parenteral treatment, so start B12 1000 mcg IM q 5 weeks.She feels comfortable giving herself injections. F/U in 3 months.  Essential (primary) hypertension BP has been mildly elevated. Losartan dose increased from 25 mg to 50 mg daily. Continue low salt diet. Continue monitoring BP regularly.  Hypothyroidism due to Hashimoto's thyroiditis Last TSH was abnormal, 0.14 (08/04/22). States that she was not informed about results or treatment. Decrease Synthroid 88 mcg to 1/2 tab Tuesdays and Thursdays. 1 tab the rest of days (Mondays,Wednesdays,Fridays,Saturdays,and Sundays). TSH to be repeated in 6-8 weeks.   We discussed possible serious and likely etiologies, options for evaluation and workup, limitations of telemedicine visit vs in person visit, treatment, treatment risks and precautions. The patient was advised to call back or seek an in-person evaluation if the symptoms worsen or if the condition fails to improve as anticipated. I discussed the assessment and treatment plan with the patient. The patient was provided an opportunity to ask questions and all were answered. The  patient agreed with the plan and demonstrated an understanding of the instructions.  Return in about 3 months (around 11/23/2022).  Adonica Fukushima [place], MD

## 2022-08-24 NOTE — Telephone Encounter (Signed)
I went over patient's results with her & she verbalized understanding. She currently has covid, has an appointment scheduled with you tomorrow to discuss but wants to know if something can be sent in today.

## 2022-08-24 NOTE — Assessment & Plan Note (Signed)
BP has been mildly elevated. Losartan dose increased from 25 mg to 50 mg daily. Continue low salt diet. Continue monitoring BP regularly.

## 2022-08-24 NOTE — Assessment & Plan Note (Addendum)
Last B12 in 07/2022 was 327, she has not had B12 injections in 6 months. We discussed options, she prefers parenteral treatment, so start B12 1000 mcg IM q 5 weeks.She feels comfortable giving herself injections. F/U in 3 months.

## 2022-08-24 NOTE — Telephone Encounter (Signed)
I moved patient's appt to today at 5pm, pt is aware. Patient was doing her B12 injections 2x a week, but hasn't had any recently.

## 2022-08-24 NOTE — Telephone Encounter (Signed)
Pt requesting medication and guidance. Has OV scheduled for tomorrow but would like meds asap

## 2022-08-24 NOTE — Assessment & Plan Note (Signed)
Last TSH was abnormal, 0.14 (08/04/22). States that she was not informed about results or treatment. Decrease Synthroid 88 mcg to 1/2 tab Tuesdays and Thursdays. 1 tab the rest of days (Mondays,Wednesdays,Fridays,Saturdays,and Sundays). TSH to be repeated in 6-8 weeks.

## 2022-08-25 ENCOUNTER — Encounter: Payer: Self-pay | Admitting: Family Medicine

## 2022-08-25 ENCOUNTER — Ambulatory Visit: Payer: 59 | Admitting: Family Medicine

## 2022-08-27 ENCOUNTER — Encounter: Payer: Self-pay | Admitting: Family Medicine

## 2022-08-30 NOTE — Telephone Encounter (Signed)
Abx will not help with viral illness. How high is the temp? CXR can be done at [facility] to check for pneumonia. Thanks, BJ

## 2022-09-16 ENCOUNTER — Ambulatory Visit: Payer: 59 | Admitting: Gastroenterology

## 2022-09-22 ENCOUNTER — Other Ambulatory Visit: Payer: Self-pay | Admitting: Family Medicine

## 2022-09-22 DIAGNOSIS — E038 Other specified hypothyroidism: Secondary | ICD-10-CM

## 2022-09-28 ENCOUNTER — Telehealth: Payer: Self-pay | Admitting: Family Medicine

## 2022-09-28 NOTE — Telephone Encounter (Signed)
Pt called to inform MD that she received a bill in the mail:  06/29/22 - Labs:   $137.43 07/02/22 - CPE:   $50 co-pay  Pt states hypothyroidism was part of the labs on 06/29/22, and the results were discussed during her CPE on 07/02/22.  Pt states she should not have been charged for the labs, nor the co-pay for the CPE.  Pt was transferred to billing and was told she needed to discuss this with PCP.  Pt is asking for a call back to discuss.

## 2022-09-29 DIAGNOSIS — R6881 Early satiety: Secondary | ICD-10-CM | POA: Diagnosis not present

## 2022-09-29 DIAGNOSIS — R197 Diarrhea, unspecified: Secondary | ICD-10-CM | POA: Diagnosis not present

## 2022-09-29 DIAGNOSIS — D510 Vitamin B12 deficiency anemia due to intrinsic factor deficiency: Secondary | ICD-10-CM | POA: Diagnosis not present

## 2022-09-29 DIAGNOSIS — K638219 Small intestinal bacterial overgrowth, unspecified: Secondary | ICD-10-CM | POA: Diagnosis not present

## 2022-09-30 ENCOUNTER — Ambulatory Visit: Payer: 59 | Admitting: Dietician

## 2022-11-04 ENCOUNTER — Other Ambulatory Visit: Payer: Self-pay | Admitting: Family Medicine

## 2022-11-04 DIAGNOSIS — G4700 Insomnia, unspecified: Secondary | ICD-10-CM

## 2022-11-22 ENCOUNTER — Other Ambulatory Visit: Payer: Self-pay

## 2022-11-22 ENCOUNTER — Other Ambulatory Visit (INDEPENDENT_AMBULATORY_CARE_PROVIDER_SITE_OTHER): Payer: 59

## 2022-11-22 DIAGNOSIS — E038 Other specified hypothyroidism: Secondary | ICD-10-CM

## 2022-11-22 DIAGNOSIS — E063 Autoimmune thyroiditis: Secondary | ICD-10-CM

## 2022-11-22 NOTE — Progress Notes (Unsigned)
HPI: [name] is a 56 y.o. female with PMHx significant for hypothyroidism/hashimoto thyroiditis,fibromyalgia,numbness/tingling, GERD,insomnia,chronic back pain, DM II and HLD here today for follow up. She was last seen on 08/24/22.  Hypothyroidism: Levothyroxine dose was adjusted a few weeks ago. She is on Levothyroxine 88 mcg taking half a tablet on Tuesdays and Thursdays, and one tablet the rest of the days. Lab Results  Component Value Date   TSH 0.14 (L) 08/04/2022   HTN: She is on Losartan 50 mg daily. Negative for severe/frequent headache, visual changes, chest pain, dyspnea, palpitation,focal weakness, or edema.  Lab Results  Component Value Date   CREATININE 0.59 08/04/2022   BUN 11 08/04/2022   NA 137 08/04/2022   K 3.9 08/04/2022   CL 103 08/04/2022   CO2 27 08/04/2022   HLD on Pravastatin 20 mg daily , she would like switching to Atorvastatin.She is tolerating medication well. Lab Results  Component Value Date   CHOL 256 (H) 06/29/2022   HDL 56.70 06/29/2022   LDLCALC 172 (H) 06/29/2022   TRIG 137.0 06/29/2022   CHOLHDL 5 06/29/2022   She is taking Ambien for sleep and reports getting about six hours of sleep with it and feeling rested the next day. She tried decreasing to 1/2 tab but did not help.  She also takes Gabapentin 300 mg, prescribed by a neurologist for paresthesias in her arms, which she finds beneficial and helps with sleep as well.She is requesting a refill, not sure if she was supposed to continue following with neurologist.  B12 deficiency: She is on B12 injections, previously injecting herself every two weeks but now taking it once a month now. Lab Results  Component Value Date   [id number] 08/04/2022   Today she is c/o pain on right elbow pain, no hx of trauma.She has noted some edema around lateral epicondyle, She has stopped doing weights due to the pain. She was seen for this problem in 06/2022, recommended topical lidocaine.  DM  II: Dx'ed in 06/2022 with HgA1C 6.6. Her last A1C of 6.6  in 06/2022. States that she is prone to hypoglycemia.  She had an eye exam two weeks ago. She reports some tingling in her feet.   Planning on traveling to [place], will stay there 6 months, requesting 6 months supply of some of her medications.  Review of Systems  Constitutional:  Positive for fatigue. Negative for chills and fever.  HENT:  Negative for mouth sores and sore throat.   Respiratory:  Negative for cough and wheezing.   Gastrointestinal:  Negative for abdominal pain, nausea and vomiting.  Endocrine: Negative for cold intolerance and heat intolerance.  Musculoskeletal:  Positive for arthralgias, back pain and myalgias. Negative for gait problem.  Skin:  Negative for rash.  Neurological:  Negative for syncope and facial asymmetry.  Psychiatric/Behavioral:  Negative for confusion and hallucinations.   See other pertinent positives and negatives in HPI.  Current Outpatient Medications on File Prior to Visit  Medication Sig Dispense Refill   albuterol (VENTOLIN HFA) 108 (90 Base) MCG/ACT inhaler Inhale 2 puffs into the lungs every 6 (six) hours as needed for wheezing or shortness of breath. 8 g 0   Blood Glucose Monitoring Suppl (ACCU-CHEK GUIDE) w/Device KIT Use to test blood sugars 1-2 times daily. 1 kit 0   Calcium Carb-Cholecalciferol (CALCIUM 1000 + D) 1000-800 MG-UNIT TABS Take 1 tablet by mouth as needed.     cholecalciferol (VITAMIN D3) 25 MCG (1000 UNIT) tablet Take 1  tablet by mouth. Three times a week as needed     cyanocobalamin (,VITAMIN B-12,) 1000 MCG/ML injection Inject 1,000 mcg into the muscle. twice a month     estradiol (ESTRACE) 1 MG tablet Take 0.5 mg by mouth daily.     gabapentin (NEURONTIN) 300 MG capsule Take 300 mg by mouth at bedtime.     levothyroxine (SYNTHROID) 88 MCG tablet 1/2 tab Tuesdays and Thursdays. 1 tab the rest of days 90 tablet 2   losartan (COZAAR) 50 MG tablet Take 1 tablet (50 mg  total) by mouth daily. 90 tablet 1   pantoprazole (PROTONIX) 40 MG tablet Take 40 mg by mouth 2 (two) times daily.     pravastatin (PRAVACHOL) 20 MG tablet Take 1 tablet (20 mg total) by mouth daily. 90 tablet 2   sucralfate (CARAFATE) 1 g tablet Take 1 tablet (1 g total) by mouth 4 (four) times daily -  with meals and at bedtime. 120 tablet 2   zolpidem (AMBIEN CR) 12.5 MG CR tablet TAKE 1 TABLET BY MOUTH AT BEDTIME AS NEEDED FOR SLEEP 90 tablet 0   No current facility-administered medications on file prior to visit.   Past Medical History:  Diagnosis Date   Fibromyalgia    GERD (gastroesophageal reflux disease)    HLD (hyperlipidemia)    Hypoglycemia    Hypothyroidism    Insomnia    Prediabetes    Allergies  Allergen Reactions   Lexapro [Escitalopram] Itching   Other     Patient does not want any narcotics- makes her itch.   Soma [Carisoprodol] Itching   Tramadol Itching    Ultram    Social History   Socioeconomic History   Marital status: Single    Spouse name: Not on file   Number of children: 0   Years of education: Not on file   Highest education level: Not on file  Occupational History   Occupation: retired  Tobacco Use   Smoking status: Never   Smokeless tobacco: Never  Vaping Use   Vaping Use: Never used  Substance and Sexual Activity   Alcohol use: Not Currently   Drug use: Never   Sexual activity: Not on file  Other Topics Concern   Not on file  Social History Narrative   Not on file   Social Determinants of Health   Financial Resource Strain: Not on file  Food Insecurity: Not on file  Transportation Needs: Not on file  Physical Activity: Not on file  Stress: Not on file  Social Connections: Not on file    height is 4' 11" (1.499 m) and weight is 90 lb 4 oz (40.9 kg). Her blood pressure is 118/70 and her pulse is 89. Her respiration is 16 and oxygen saturation is 99%.  Wt Readings from Last 3 Encounters:  11/23/22 90 lb 4 oz (40.9 kg)   07/19/22 94 lb (42.6 kg)  07/06/22 91 lb (41.3 kg)   Physical Exam Vitals and nursing note reviewed.  Constitutional:      General: She is not in acute distress.    Appearance: She is well-developed.  HENT:     Head: Normocephalic and atraumatic.     Mouth/Throat:     Mouth: Mucous membranes are moist.     Pharynx: Oropharynx is clear.  Eyes:     Conjunctiva/sclera: Conjunctivae normal.  Cardiovascular:     Rate and Rhythm: Normal rate and regular rhythm.     Pulses:  Dorsalis pedis pulses are 2+ on the right side and 2+ on the left side.     Heart sounds: No murmur heard. Pulmonary:     Effort: Pulmonary effort is normal. No respiratory distress.     Breath sounds: Normal breath sounds.  Abdominal:     Palpations: Abdomen is soft. There is no hepatomegaly or mass.     Tenderness: There is no abdominal tenderness.  Musculoskeletal:     Right elbow: No deformity or effusion. Normal range of motion. Tenderness present in lateral epicondyle.     Comments: Lateral aspect of right elbow exacerbated by supination against resistance and pronation. No erythema.  Lymphadenopathy:     Cervical: No cervical adenopathy.  Skin:    General: Skin is warm.     Findings: No erythema or rash.  Neurological:     General: No focal deficit present.     Mental Status: She is alert and oriented to person, place, and time.     Cranial Nerves: No cranial nerve deficit.     Gait: Gait normal.  Psychiatric:     Comments: Well groomed, good eye contact.   ASSESSMENT AND PLAN:  Ms. Dava was seen today for follow-up.  Diagnoses and all orders for this visit: Lab Results  Component Value Date   TSH 2.61 11/22/2022   Lab Results  Component Value Date   HGBA1C 7.2 (A) 11/23/2022   Insomnia, unspecified type Assessment & Plan: Problem is well controlled. Continue Ambien CR 12.5 mg. Good sleep hygiene also recommended. 3 months supply because it helps with  cost.   Hypothyroidism due to Hashimoto's thyroiditis Assessment & Plan: TSH result is pending. Continue Levothyroxine 88 mcg daily for 5 days and 1/2 tab Tuesdays and Thursdays.  Orders: -     Levothyroxine Sodium; 1/2 tab Tuesdays and Thursdays. 1 tab the rest of days  Dispense: 180 tablet; Refill: 0  Essential (primary) hypertension Assessment & Plan: BP has been well controlled. Continue Losartan 50 mg daily and low salt diet. Continue monitoring BP regularly. Eye exam is current.  Orders: -     Losartan Potassium; Take 1 tablet (50 mg total) by mouth daily.  Dispense: 180 tablet; Refill: 0  Hyperlipidemia, unspecified hyperlipidemia type -     Atorvastatin Calcium; Take 1 tablet (10 mg total) by mouth daily.  Dispense: 90 tablet; Refill: 1  B12 deficiency Assessment & Plan: Continue B12 1000 mcg q 4 weeks. Will plan on checking B12 next visit.  Orders: -     Cyanocobalamin; Inject 1 mL (1,000 mcg total) into the muscle every 30 (thirty) days.  Dispense: 10 mL; Refill: 0  Right elbow pain Assessment & Plan: Lateral epicondylitis, other possible etiologies discussed. It has been present since 06/2022 and getting worse. Sport medicine referral placed.  Orders: -     Ambulatory referral to Sports Medicine  Type 2 diabetes mellitus with other specified complication, without long-term current use of insulin ([place]) Assessment & Plan: HgA1C went from 6.6 to 7.2. Metformin 500 mg bid with meals started today, some side effects discussed. Annual eye exam, periodic dental and foot care to continue. F/U in 5-6 months  Orders: -     POCT glycosylated hemoglobin (Hb A1C) -     Microalbumin / creatinine urine ratio; Future -     metFORMIN HCl ER; Take 1 tablet (500 mg total) by mouth daily with breakfast.  Dispense: 180 tablet; Refill: 0  Need for influenza vaccination -  Flu Vaccine QUAD 62moIM (Fluarix, Fluzone & Alfiuria Quad PF)  Need for pneumococcal vaccination -      Pneumococcal polysaccharide vaccine 23-valent greater than or equal to 2yo subcutaneous/IM  Numbness and tingling Assessment & Plan: Evaluated by neuro in 02/2021 due to multiple neurologic symptoms, numbness,tingling,cervical neuralgia,and fibromyalgia. Started at that time with Gabapentin 300 mg daily, she feels like medication was helping and would like to resume it.  Continue Gabapentin 300 mg at bedtime.  Orders: -     Gabapentin; Take 1 capsule (300 mg total) by mouth at bedtime.  Dispense: 90 capsule; Refill: 0   I spent a total of 42 minutes in both face to face and non face to face activities for this visit on the date of this encounter. During this time history was obtained and documented, examination was performed, prior labs reviewed, and assessment/plan discussed.  Return in about 4 months (around 03/25/2023) for chronic problems.  Flemon Kelty G. JMartinique MD  [facility].

## 2022-11-23 ENCOUNTER — Encounter: Payer: Self-pay | Admitting: Family Medicine

## 2022-11-23 ENCOUNTER — Ambulatory Visit: Payer: 59 | Admitting: Family Medicine

## 2022-11-23 VITALS — BP 118/70 | HR 89 | Resp 16 | Ht 59.0 in | Wt 90.2 lb

## 2022-11-23 DIAGNOSIS — G4700 Insomnia, unspecified: Secondary | ICD-10-CM

## 2022-11-23 DIAGNOSIS — Z23 Encounter for immunization: Secondary | ICD-10-CM

## 2022-11-23 DIAGNOSIS — R200 Anesthesia of skin: Secondary | ICD-10-CM | POA: Diagnosis not present

## 2022-11-23 DIAGNOSIS — E038 Other specified hypothyroidism: Secondary | ICD-10-CM

## 2022-11-23 DIAGNOSIS — E785 Hyperlipidemia, unspecified: Secondary | ICD-10-CM

## 2022-11-23 DIAGNOSIS — E1169 Type 2 diabetes mellitus with other specified complication: Secondary | ICD-10-CM | POA: Diagnosis not present

## 2022-11-23 DIAGNOSIS — I10 Essential (primary) hypertension: Secondary | ICD-10-CM | POA: Diagnosis not present

## 2022-11-23 DIAGNOSIS — M25521 Pain in right elbow: Secondary | ICD-10-CM | POA: Diagnosis not present

## 2022-11-23 DIAGNOSIS — R202 Paresthesia of skin: Secondary | ICD-10-CM

## 2022-11-23 DIAGNOSIS — E063 Autoimmune thyroiditis: Secondary | ICD-10-CM | POA: Diagnosis not present

## 2022-11-23 DIAGNOSIS — E538 Deficiency of other specified B group vitamins: Secondary | ICD-10-CM | POA: Diagnosis not present

## 2022-11-23 LAB — TSH: TSH: 2.61 u[IU]/mL (ref 0.35–5.50)

## 2022-11-23 LAB — POCT GLYCOSYLATED HEMOGLOBIN (HGB A1C): Hemoglobin A1C: 7.2 % — AB (ref 4.0–5.6)

## 2022-11-23 LAB — MICROALBUMIN / CREATININE URINE RATIO
Creatinine,U: 217.5 mg/dL
Microalb Creat Ratio: 0.9 mg/g (ref 0.0–30.0)
Microalb, Ur: 2 mg/dL — ABNORMAL HIGH (ref 0.0–1.9)

## 2022-11-23 MED ORDER — LEVOTHYROXINE SODIUM 88 MCG PO TABS: ORAL_TABLET | ORAL | 0 refills | Status: AC

## 2022-11-23 MED ORDER — GABAPENTIN 300 MG PO CAPS: 300.0000 mg | ORAL_CAPSULE | Freq: Every day | ORAL | 0 refills | Status: DC

## 2022-11-23 MED ORDER — METFORMIN HCL ER 500 MG PO TB24: 500.0000 mg | ORAL_TABLET | Freq: Every day | ORAL | 0 refills | Status: DC

## 2022-11-23 MED ORDER — LOSARTAN POTASSIUM 50 MG PO TABS: 50.0000 mg | ORAL_TABLET | Freq: Every day | ORAL | 0 refills | Status: DC

## 2022-11-23 MED ORDER — ATORVASTATIN CALCIUM 10 MG PO TABS: 10.0000 mg | ORAL_TABLET | Freq: Every day | ORAL | 1 refills | Status: DC

## 2022-11-23 MED ORDER — CYANOCOBALAMIN 1000 MCG/ML IJ SOLN: 1000.0000 ug | INTRAMUSCULAR | 0 refills | Status: DC

## 2022-11-23 NOTE — Patient Instructions (Addendum)
A few things to remember from today's visit:  Insomnia, unspecified type  Hypothyroidism due to Hashimoto's thyroiditis - Plan: levothyroxine (SYNTHROID) 88 MCG tablet  Essential (primary) hypertension  Hyperlipidemia, unspecified hyperlipidemia type - Plan: atorvastatin (LIPITOR) 10 MG tablet  B12 deficiency - Plan: cyanocobalamin (VITAMIN B12) 1000 MCG/ML injection  Right elbow pain - Plan: Ambulatory referral to Sports Medicine  Type 2 diabetes mellitus with other specified complication, without long-term current use of insulin ([facility]) - Plan: POC HgB A1c, Microalbumin / creatinine urine ratio  Fasting labs next visit. Resume Gabapentin 300 mg at bedtime. B12 every 4 weeks. Pending TSH result.  No changes in rest. Arrange appt with your gyn to discuss hormonal therapy and for preventive female care. Tennis Elbow  Tennis elbow is irritation and swelling (inflammation) in your outer forearm, near your elbow. Swelling affects the tissues that connect muscle to bone (tendons). Tennis elbow can happen playing any sport or doing any job where you use your elbow too much. It is caused by doing the same motion over and over. What are the causes? This condition is often caused by playing sports or doing work where you need to keep moving your forearm the same way. Sometimes, it may be caused by a sudden injury. What increases the risk? You are more likely to get tennis elbow if you play tennis or another racket sport. You also have a higher risk if you often use your hands for work. This includes: People who use computers. Holiday representative workers. People who work in a factory. Musicians. Cooks. Cashiers. What are the signs or symptoms? Pain and tenderness in your forearm and the outer part of your elbow. You may have pain all the time or only when you use your arm. A burning feeling. This starts in your elbow and spreads down your arm. A weak grip in your hand. How is this  treated? Resting and icing your arm is often the first treatment. Your doctor may also recommend: Medicines to reduce pain and swelling. An elbow strap. Physical therapy. This may include massage or exercises or both. An elbow brace. If these do not help your symptoms get better, your doctor may recommend surgery. Follow these instructions at home: If you have a brace or strap: Wear the brace or strap as told by your doctor. Take it off only as told by your doctor. Check the skin around the brace or strap every day. Tell your doctor if you see problems. Loosen it if your fingers: Tingle. Become numb. Turn cold and blue. Keep the brace or strap clean. If the brace or strap is not waterproof: Do not let it get wet. Cover it with a watertight covering when you take a bath or a shower. Managing pain, stiffness, and swelling  If told, put ice on the injured area. To do this: If you have a removable brace or strap, take it off as told by your doctor. Put ice in a plastic bag. Place a towel between your skin and the bag. Leave the ice on for 20 minutes, 2-3 times a day. Take off the ice if your skin turns bright red. This is very important. If you cannot feel pain, heat, or cold, you have a greater risk of damage to the area. Move your fingers often. Activity Rest your elbow and wrist. Avoid activities that can cause elbow problems as told by your doctor. Do exercises as told by your doctor. If you lift an object, lift it with your palm  facing up. Lifestyle If your tennis elbow is caused by sports, check your equipment and make sure that: You are using it the right way. It fits you well. If your tennis elbow is caused by work or computer use, take breaks often to stretch your arm. Talk with your manager about how you can make your condition better at work. General instructions Take over-the-counter and prescription medicines only as told by your doctor. Do not smoke or use any  products that contain nicotine or tobacco. If you need help quitting, ask your doctor. Keep all follow-up visits. How is this prevented? Before and after being active: Warm up and stretch before being active. Cool down and stretch after being active. Give your body time to rest between activities. While being active: Make sure to use equipment that fits you. If you play tennis, put power in your stroke with your lower body. Avoid using your arm only. Maintain physical fitness. This includes: Strength. Flexibility. Endurance. Do exercises to strengthen the forearm muscles. Contact a doctor if: Your pain does not get better with treatment. Your pain gets worse. You have weakness in your forearm, hand, or fingers. You cannot feel your forearm, hand, or fingers. Get help right away if: Your pain is very bad. You cannot move your wrist. Summary Tennis elbow is irritation and swelling (inflammation) in your outer forearm, near your elbow. Tennis elbow is caused by doing the same motion over and over. Rest your elbow and wrist. Avoid activities as told by your doctor. If told, put ice on the injured area for 20 minutes, 2-3 times a day. This information is not intended to replace advice given to you by your health care provider. Make sure you discuss any questions you have with your health care provider. Document Revised: 06/17/2020 Document Reviewed: 06/17/2020 Elsevier Patient Education  2023 [employer].  If you need refills for medications you take chronically, please call your pharmacy. Do not use My Chart to request refills or for acute issues that need immediate attention. If you send a my chart message, it may take a few days to be addressed, specially if I am not in the office.  Please be sure medication list is accurate. If a new problem present, please set up appointment sooner than planned today.

## 2022-11-23 NOTE — Assessment & Plan Note (Signed)
BP has been well controlled. Continue Losartan 50 mg daily and low salt diet. Continue monitoring BP regularly. Eye exam is current.

## 2022-11-24 MED ORDER — CYANOCOBALAMIN 1000 MCG/ML IJ SOLN: 1000.0000 ug | INTRAMUSCULAR | 0 refills | Status: DC

## 2022-11-25 ENCOUNTER — Telehealth: Payer: Self-pay | Admitting: Family Medicine

## 2022-11-25 DIAGNOSIS — R200 Anesthesia of skin: Secondary | ICD-10-CM | POA: Insufficient documentation

## 2022-11-25 DIAGNOSIS — M25521 Pain in right elbow: Secondary | ICD-10-CM | POA: Insufficient documentation

## 2022-11-25 NOTE — Assessment & Plan Note (Addendum)
Problem is well controlled. Continue Ambien CR 12.5 mg. Good sleep hygiene also recommended. 3 months supply because it helps with cost.

## 2022-11-25 NOTE — Assessment & Plan Note (Signed)
Continue B12 1000 mcg q 4 weeks. Will plan on checking B12 next visit.

## 2022-11-25 NOTE — Assessment & Plan Note (Signed)
Evaluated by neuro in 02/2021 due to multiple neurologic symptoms, numbness,tingling,cervical neuralgia,and fibromyalgia. Started at that time with Gabapentin 300 mg daily, she feels like medication was helping and would like to resume it.  Continue Gabapentin 300 mg at bedtime.

## 2022-11-25 NOTE — Assessment & Plan Note (Signed)
TSH result is pending. Continue Levothyroxine 88 mcg daily for 5 days and 1/2 tab Tuesdays and Thursdays.

## 2022-11-25 NOTE — Telephone Encounter (Signed)
Pt call and stated she want a call back about her labs result because [place] is going [place] .

## 2022-11-25 NOTE — Assessment & Plan Note (Signed)
Lateral epicondylitis, other possible etiologies discussed. It has been present since 06/2022 and getting worse. Sport medicine referral placed.

## 2022-11-25 NOTE — Assessment & Plan Note (Addendum)
HgA1C went from 6.6 to 7.2. Metformin 500 mg bid with meals started today, some side effects discussed. Annual eye exam, periodic dental and foot care to continue. F/U in 5-6 months

## 2022-11-26 NOTE — Telephone Encounter (Signed)
I called and spoke with patient. She is aware of her results and verbalized understanding.

## 2022-12-02 ENCOUNTER — Encounter: Payer: Self-pay | Admitting: Family Medicine

## 2022-12-07 ENCOUNTER — Encounter: Payer: Self-pay | Admitting: Family Medicine

## 2022-12-15 ENCOUNTER — Other Ambulatory Visit: Payer: Self-pay | Admitting: Family Medicine

## 2022-12-15 MED ORDER — PRAVASTATIN SODIUM 20 MG PO TABS: 20.0000 mg | ORAL_TABLET | Freq: Every day | ORAL | 3 refills | Status: DC

## 2022-12-16 DIAGNOSIS — Z833 Family history of diabetes mellitus: Secondary | ICD-10-CM | POA: Diagnosis not present

## 2022-12-16 DIAGNOSIS — E063 Autoimmune thyroiditis: Secondary | ICD-10-CM | POA: Diagnosis not present

## 2022-12-16 DIAGNOSIS — E119 Type 2 diabetes mellitus without complications: Secondary | ICD-10-CM | POA: Diagnosis not present

## 2022-12-16 DIAGNOSIS — M810 Age-related osteoporosis without current pathological fracture: Secondary | ICD-10-CM | POA: Diagnosis not present

## 2022-12-16 DIAGNOSIS — E039 Hypothyroidism, unspecified: Secondary | ICD-10-CM | POA: Diagnosis not present

## 2023-05-20 LAB — HM DIABETES EYE EXAM

## 2023-05-20 LAB — BASIC METABOLIC PANEL
Creatinine: 0.6 (ref 0.5–1.1)
Potassium: 4.1 mEq/L (ref 3.5–5.1)
Sodium: 135 — AB (ref 137–147)

## 2023-05-20 LAB — HEMOGLOBIN A1C: Hemoglobin A1C: 6

## 2023-05-20 LAB — VITAMIN B12: Vitamin B-12: 732

## 2023-05-20 LAB — HEPATIC FUNCTION PANEL
ALT: 14 U/L (ref 7–35)
AST: 12 — AB (ref 13–35)
Bilirubin, Direct: 0.5 — AB
Bilirubin, Total: 0.8

## 2023-05-20 LAB — TSH: TSH: 2.24 (ref 0.41–5.90)

## 2023-05-20 LAB — LIPID PANEL
Cholesterol: 196 (ref 0–200)
HDL: 49 (ref 35–70)
LDL Cholesterol: 96
Triglycerides: 115 (ref 40–160)

## 2023-05-20 LAB — VITAMIN D 25 HYDROXY (VIT D DEFICIENCY, FRACTURES): Vit D, 25-Hydroxy: 33.04

## 2023-05-20 LAB — MICROALBUMIN, URINE: Microalb, Ur: 1.28

## 2023-05-20 LAB — IRON,TIBC AND FERRITIN PANEL
Ferritin: 97.1
Iron: 97

## 2023-05-20 LAB — POCT ERYTHROCYTE SEDIMENTATION RATE, NON-AUTOMATED: Sed Rate: 16

## 2023-05-20 LAB — CBC AND DIFFERENTIAL
HCT: 39 (ref 36–46)
Hemoglobin: 12.7 (ref 12.0–16.0)
Platelets: 334 10*3/uL (ref 150–400)

## 2023-05-20 LAB — PROTEIN / CREATININE RATIO, URINE: Creatinine, Urine: 0.29

## 2023-05-20 LAB — MICROALBUMIN / CREATININE URINE RATIO: Microalb Creat Ratio: 4.41

## 2023-06-14 DIAGNOSIS — E063 Autoimmune thyroiditis: Secondary | ICD-10-CM | POA: Diagnosis not present

## 2023-06-14 DIAGNOSIS — R252 Cramp and spasm: Secondary | ICD-10-CM | POA: Diagnosis not present

## 2023-06-14 DIAGNOSIS — E039 Hypothyroidism, unspecified: Secondary | ICD-10-CM | POA: Diagnosis not present

## 2023-06-14 DIAGNOSIS — E139 Other specified diabetes mellitus without complications: Secondary | ICD-10-CM | POA: Diagnosis not present

## 2023-06-14 DIAGNOSIS — Z794 Long term (current) use of insulin: Secondary | ICD-10-CM | POA: Diagnosis not present

## 2023-06-14 DIAGNOSIS — M810 Age-related osteoporosis without current pathological fracture: Secondary | ICD-10-CM | POA: Diagnosis not present

## 2023-07-12 ENCOUNTER — Ambulatory Visit: Payer: 59 | Admitting: Family Medicine

## 2023-07-13 NOTE — Progress Notes (Unsigned)
HPI: [name] is a 57 y.o. female, who is here today for chronic disease management.  Last seen on 11/23/22  Hypothyroidism: Levothyroxine dose was adjusted a few weeks ago. She is on Levothyroxine 88 mcg taking half a tablet on Tuesdays and Thursdays, and one tablet the rest of the days.   Lab Results  Component Value Date   TSH 2.24 05/20/2023   HTN: She is on Losartan 50 mg daily. Negative for severe/frequent headache, visual changes, chest pain, dyspnea, palpitation,focal weakness, or edema. Lab Results  Component Value Date   NA 135 (A) 05/20/2023   K 4.1 05/20/2023   CO2 27 08/04/2022   GLUCOSE 123 (H) 08/04/2022   BUN 11 08/04/2022   CREATININE 0.6 05/20/2023   CALCIUM 9.1 08/04/2022   GFR 101.34 08/04/2022   GFRNONAA 108 05/06/2020   Lab Results  Component Value Date   VITAMINB12 732 05/20/2023    Review of Systems See other pertinent positives and negatives in HPI.  Current Outpatient Medications on File Prior to Visit  Medication Sig Dispense Refill   albuterol (VENTOLIN HFA) 108 (90 Base) MCG/ACT inhaler Inhale 2 puffs into the lungs every 6 (six) hours as needed for wheezing or shortness of breath. 8 g 0   Calcium Carb-Cholecalciferol (CALCIUM 1000 + D) 1000-800 MG-UNIT TABS Take 1 tablet by mouth as needed.     cholecalciferol (VITAMIN D3) 25 MCG (1000 UNIT) tablet Take 1 tablet by mouth. Three times a week as needed     estradiol (ESTRACE) 1 MG tablet Take 0.5 mg by mouth daily.     insulin aspart (FIASP FLEXTOUCH) 100 UNIT/ML FlexTouch Pen Inject 4 U in the morning, 5 U at lunch, and 4 U in the evening.     insulin glargine (LANTUS SOLOSTAR) 100 UNIT/ML Solostar Pen Inject 5 Units into the skin at bedtime.     levothyroxine (SYNTHROID) 88 MCG tablet 1/2 tab Tuesdays and Thursdays. 1 tab the rest of days 180 tablet 0   pantoprazole (PROTONIX) 40 MG tablet Take 40 mg by mouth 2 (two) times daily.     rosuvastatin (CRESTOR) 5 MG tablet Take 5 mg by  mouth daily.     sucralfate (CARAFATE) 1 g tablet Take 1 tablet (1 g total) by mouth 4 (four) times daily -  with meals and at bedtime. 120 tablet 2   Teriparatide, Recombinant, (FORTEO) 600 MCG/2.4ML SOPN Inject into the skin daily.     No current facility-administered medications on file prior to visit.   Past Medical History:  Diagnosis Date   Fibromyalgia    GERD (gastroesophageal reflux disease)    HLD (hyperlipidemia)    Hypoglycemia    Hypothyroidism    Insomnia    Prediabetes    Allergies  Allergen Reactions   Lexapro [Escitalopram] Itching   Other     Patient does not want any narcotics- makes her itch.   Soma [Carisoprodol] Itching   Tramadol Itching    Ultram    Social History   Socioeconomic History   Marital status: Single    Spouse name: Not on file   Number of children: 0   Years of education: Not on file   Highest education level: Not on file  Occupational History   Occupation: retired  Tobacco Use   Smoking status: Never   Smokeless tobacco: Never  Vaping Use   Vaping status: Never Used  Substance and Sexual Activity   Alcohol use: Not Currently   Drug use:  Never   Sexual activity: Not on file  Other Topics Concern   Not on file  Social History Narrative   Not on file   Social Determinants of Health   Financial Resource Strain: Low Risk  (08/07/2021)   Received from [facility], [facility]   Overall Financial Resource Strain (CARDIA)    Difficulty of Paying Living Expenses: Not hard at all  Food Insecurity: No Food Insecurity (08/07/2021)   Received from [facility], [facility]   Hunger Vital Sign    Worried About Running Out of Food in the Last Year: Never true    Ran Out of Food in the Last Year: Never true  Transportation Needs: No Transportation Needs (08/07/2021)   Received from [facility], [facility]   PRAPARE - Transportation    Lack of Transportation (Medical): No    Lack of Transportation (Non-Medical): No   Physical Activity: Inactive (08/07/2021)   Received from [facility], [facility]   Exercise Vital Sign    Days of Exercise per Week: 0 days    Minutes of Exercise per Session: 0 min  Stress: No Stress Concern Present (08/07/2021)   Received from [facility], [facility] of Occupational Health - Occupational Stress Questionnaire    Feeling of Stress : Not at all  Social Connections: Unknown (04/26/2022)   Received from [facility], [facility]   Social Network    Social Network: Not on file   Vitals:   07/15/23 1413  BP: 110/70  Pulse: 88  Temp: 98.4 F (36.9 C)  SpO2: 98%   Body mass index is 19.84 kg/m.  Physical Exam Vitals and nursing note reviewed.  Constitutional:      General: She is not in acute distress.    Appearance: She is well-developed.  HENT:     Head: Normocephalic and atraumatic.     Mouth/Throat:     Mouth: Mucous membranes are moist.     Pharynx: Oropharynx is clear.  Eyes:     Conjunctiva/sclera: Conjunctivae normal.  Cardiovascular:     Rate and Rhythm: Normal rate and regular rhythm.     Pulses:          Dorsalis pedis pulses are 2+ on the right side and 2+ on the left side.     Heart sounds: No murmur heard. Pulmonary:     Effort: Pulmonary effort is normal. No respiratory distress.     Breath sounds: Normal breath sounds.  Abdominal:     Palpations: Abdomen is soft. There is no hepatomegaly or mass.     Tenderness: There is no abdominal tenderness.  Musculoskeletal:     Cervical back: Tenderness present. No bony tenderness.     Thoracic back: Tenderness present.     Lumbar back: Tenderness present.  Lymphadenopathy:     Cervical: No cervical adenopathy.  Skin:    General: Skin is warm.     Findings: No erythema or rash.  Neurological:     General: No focal deficit present.     Mental Status: She is alert and oriented to person, place, and time.     Cranial Nerves: No cranial nerve deficit.     Gait:  Gait normal.  Psychiatric:     Comments: Well groomed, good eye contact.     ASSESSMENT AND PLAN:  [name] was seen today for medical management of chronic issues.  Diagnoses and all orders for this visit:  Pernicious anemia -  cyanocobalamin (VITAMIN B12) 1000 MCG/ML injection; Inject 1 mL (1,000 mcg total) into the muscle every 30 (thirty) days.  Numbness and tingling -     gabapentin (NEURONTIN) 300 MG capsule; Take 1 capsule (300 mg total) by mouth at bedtime.  Insomnia, unspecified type -     zolpidem (AMBIEN CR) 12.5 MG CR tablet; Take 1 tablet (12.5 mg total) by mouth at bedtime as needed. for sleep  Myalgia  Essential (primary) hypertension -     losartan (COZAAR) 50 MG tablet; Take 1 tablet (50 mg total) by mouth daily.  Hyperlipidemia, unspecified hyperlipidemia type    No orders of the defined types were placed in this encounter.   Hyperlipidemia Continue rosuvastatin 5 mg daily and low-fat diet. We will plan on fasting lipid panel next visit.  I spent a total of 49 minutes in both face to face and non face to face activities for this visit on the date of this encounter. During this time history was obtained and documented, examination was performed, prior labs/imaging reviewed***, and assessment/plan discussed.  Return in about 2 months (around 09/15/2023).  Milayna Rotenberg G. [place], MD  [facility]. [place] office.

## 2023-07-15 ENCOUNTER — Encounter: Payer: Self-pay | Admitting: Family Medicine

## 2023-07-15 ENCOUNTER — Telehealth: Payer: Self-pay | Admitting: Family Medicine

## 2023-07-15 ENCOUNTER — Ambulatory Visit: Payer: 59 | Admitting: Family Medicine

## 2023-07-15 VITALS — BP 110/70 | HR 88 | Temp 98.4°F | Ht 59.0 in | Wt 98.2 lb

## 2023-07-15 DIAGNOSIS — G4700 Insomnia, unspecified: Secondary | ICD-10-CM

## 2023-07-15 DIAGNOSIS — I10 Essential (primary) hypertension: Secondary | ICD-10-CM | POA: Diagnosis not present

## 2023-07-15 DIAGNOSIS — R202 Paresthesia of skin: Secondary | ICD-10-CM

## 2023-07-15 DIAGNOSIS — R200 Anesthesia of skin: Secondary | ICD-10-CM | POA: Diagnosis not present

## 2023-07-15 DIAGNOSIS — E139 Other specified diabetes mellitus without complications: Secondary | ICD-10-CM

## 2023-07-15 DIAGNOSIS — E038 Other specified hypothyroidism: Secondary | ICD-10-CM | POA: Diagnosis not present

## 2023-07-15 DIAGNOSIS — K219 Gastro-esophageal reflux disease without esophagitis: Secondary | ICD-10-CM | POA: Diagnosis not present

## 2023-07-15 DIAGNOSIS — E785 Hyperlipidemia, unspecified: Secondary | ICD-10-CM | POA: Diagnosis not present

## 2023-07-15 DIAGNOSIS — D510 Vitamin B12 deficiency anemia due to intrinsic factor deficiency: Secondary | ICD-10-CM | POA: Diagnosis not present

## 2023-07-15 DIAGNOSIS — E063 Autoimmune thyroiditis: Secondary | ICD-10-CM

## 2023-07-15 DIAGNOSIS — M816 Localized osteoporosis [Lequesne]: Secondary | ICD-10-CM

## 2023-07-15 DIAGNOSIS — M791 Myalgia, unspecified site: Secondary | ICD-10-CM | POA: Diagnosis not present

## 2023-07-15 DIAGNOSIS — E109 Type 1 diabetes mellitus without complications: Secondary | ICD-10-CM

## 2023-07-15 MED ORDER — GABAPENTIN 300 MG PO CAPS
300.0000 mg | ORAL_CAPSULE | Freq: Every day | ORAL | 1 refills | Status: DC
Start: 2023-07-15 — End: 2024-03-27

## 2023-07-15 MED ORDER — CYANOCOBALAMIN 1000 MCG/ML IJ SOLN
1000.0000 ug | INTRAMUSCULAR | 2 refills | Status: AC
Start: 2023-07-15 — End: ?

## 2023-07-15 MED ORDER — ZOLPIDEM TARTRATE ER 12.5 MG PO TBCR
12.5000 mg | EXTENDED_RELEASE_TABLET | Freq: Every evening | ORAL | 0 refills | Status: DC | PRN
Start: 2023-07-15 — End: 2023-09-26

## 2023-07-15 MED ORDER — LOSARTAN POTASSIUM 50 MG PO TABS
50.0000 mg | ORAL_TABLET | Freq: Every day | ORAL | 1 refills | Status: DC
Start: 2023-07-15 — End: 2024-03-27

## 2023-07-15 NOTE — Patient Instructions (Addendum)
A few things to remember from today's visit:  B12 deficiency  Numbness and tingling - Plan: gabapentin (NEURONTIN) 300 MG capsule  Insomnia, unspecified type - Plan: zolpidem (AMBIEN CR) 12.5 MG CR tablet  Myalgia  Essential (primary) hypertension - Plan: losartan (COZAAR) 50 MG tablet Resume Gabapentin 300 mg at night. No changes in Ambien. Keep appt with endocrinologist for hypothyroidism,DMI,osteoporosis,and he may also treat your pernicious anemia.  Labs for rheumatologic disorders: RF,CCP,CRP,sed rate, and ANA mainly.  If you need refills for medications you take chronically, please call your pharmacy. Do not use My Chart to request refills or for acute issues that need immediate attention. If you send a my chart message, it may take a few days to be addressed, specially if I am not in the office.  Please be sure medication list is accurate. If a new problem present, please set up appointment sooner than planned today.

## 2023-07-15 NOTE — Assessment & Plan Note (Addendum)
LDL 95 in 04/2023. Continue rosuvastatin 5 mg daily and low-fat diet. We will plan on fasting lipid panel next visit.

## 2023-07-15 NOTE — Assessment & Plan Note (Signed)
Problem has been well controlled. Continue Losartan 50 mg daily and low salt diet. Continue monitoring BP regularly. Eye exam is current.

## 2023-07-15 NOTE — Assessment & Plan Note (Signed)
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 05/20/23 B12 was 732. Continue B12 1000 mcg IM q 4 weeks. Will plan on checking B12 next visit.

## 2023-07-15 NOTE — Telephone Encounter (Signed)
Pt states she couldn't remember if provider was going to call in a prescription for her for her B12 injections. She is requesting a call back to confirm if that prescription will be placed. Call back number: [phone].

## 2023-07-15 NOTE — Telephone Encounter (Signed)
Rx sent, B12 1000 mcg q 30 days. Thanks, BJ

## 2023-07-15 NOTE — Assessment & Plan Note (Signed)
Evaluated by neuro in 02/2021 due to multiple neurologic symptoms, numbness,tingling,cervical neuralgia,and fibromyalgia. Resume Gabapentin 300 mg daily. Low impact exercise.

## 2023-07-15 NOTE — Assessment & Plan Note (Signed)
She has Hx of fibromyalgia, which could explained generalized myalgias.Has not been on Gabapentin for a few months. Resume Gabapentin 300 mg at bedtime, some side effects discussed. May add Duloxetin or savella or increase gabapentin if problem does not greatly improve.

## 2023-07-15 NOTE — Assessment & Plan Note (Signed)
Problem is well controlled. Continue Ambien CR 12.5 mg. Good sleep hygiene to continue. 3 months supply because it helps with cost.

## 2023-07-15 NOTE — Assessment & Plan Note (Signed)
Started on Forteo a few months ago. Will continue following with endo

## 2023-07-15 NOTE — Assessment & Plan Note (Signed)
Re-evaluated while she was in [place], Dx'ed with DM I. She is on Lantus. Has appt scheduled with endocrinologist.

## 2023-07-16 NOTE — Assessment & Plan Note (Signed)
TSH 2.2 on 05/20/23. Continue Levothyroxine 88 mcg daily for 5 days and 1/2 tab Tuesdays and Thursdays.

## 2023-07-16 NOTE — Assessment & Plan Note (Signed)
Current on Protonix 40 mg bid. 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 She has seen Dr. Lavon Paganini, recommend calling her office and try to re-establish care.  She will let me know if a new referral is needed.

## 2023-07-18 ENCOUNTER — Telehealth: Payer: Self-pay | Admitting: Gastroenterology

## 2023-07-18 NOTE — Telephone Encounter (Signed)
Inbound call from patient requesting to schedule a follow up visit with Dr. Lavon Paganini. Patient saw [facility] in 2023 due Dr. Lavon Paganini referring her to go there due to there being no available appointments. Patient would like to discuss endoscopy results from 07/06/2022. Please advise on scheduling. Thank you.

## 2023-07-21 NOTE — Telephone Encounter (Signed)
Patient should follow-up with GI at [facility]

## 2023-07-22 NOTE — Telephone Encounter (Signed)
Spoke with pt and she is aware of Dr. Elana Alm recommendations but she is very upset. She wants to be followed in [place], states she cannot afford to drive 2 hours every time to be seen. Please advise.

## 2023-07-22 NOTE — Telephone Encounter (Signed)
Unfortunately we have exhausted all options here and she will be better served at a tertiary care center[facility].

## 2023-08-04 ENCOUNTER — Encounter (INDEPENDENT_AMBULATORY_CARE_PROVIDER_SITE_OTHER): Payer: Self-pay

## 2023-08-11 DIAGNOSIS — E139 Other specified diabetes mellitus without complications: Secondary | ICD-10-CM | POA: Diagnosis not present

## 2023-08-11 DIAGNOSIS — M810 Age-related osteoporosis without current pathological fracture: Secondary | ICD-10-CM | POA: Diagnosis not present

## 2023-08-11 DIAGNOSIS — Z794 Long term (current) use of insulin: Secondary | ICD-10-CM | POA: Diagnosis not present

## 2023-08-11 DIAGNOSIS — D510 Vitamin B12 deficiency anemia due to intrinsic factor deficiency: Secondary | ICD-10-CM | POA: Diagnosis not present

## 2023-08-11 DIAGNOSIS — E063 Autoimmune thyroiditis: Secondary | ICD-10-CM | POA: Diagnosis not present

## 2023-08-11 DIAGNOSIS — R252 Cramp and spasm: Secondary | ICD-10-CM | POA: Diagnosis not present

## 2023-08-11 DIAGNOSIS — E039 Hypothyroidism, unspecified: Secondary | ICD-10-CM | POA: Diagnosis not present

## 2023-09-16 ENCOUNTER — Ambulatory Visit: Payer: 59 | Admitting: Family Medicine

## 2023-09-20 ENCOUNTER — Ambulatory Visit: Payer: 59 | Admitting: Family Medicine

## 2023-09-26 ENCOUNTER — Encounter: Payer: Self-pay | Admitting: Family Medicine

## 2023-09-26 ENCOUNTER — Ambulatory Visit: Payer: 59 | Admitting: Family Medicine

## 2023-09-26 VITALS — BP 110/74 | HR 82 | Resp 16 | Ht 59.0 in | Wt 100.5 lb

## 2023-09-26 DIAGNOSIS — Z23 Encounter for immunization: Secondary | ICD-10-CM

## 2023-09-26 DIAGNOSIS — K219 Gastro-esophageal reflux disease without esophagitis: Secondary | ICD-10-CM

## 2023-09-26 DIAGNOSIS — M791 Myalgia, unspecified site: Secondary | ICD-10-CM

## 2023-09-26 DIAGNOSIS — G4700 Insomnia, unspecified: Secondary | ICD-10-CM | POA: Diagnosis not present

## 2023-09-26 DIAGNOSIS — I10 Essential (primary) hypertension: Secondary | ICD-10-CM | POA: Diagnosis not present

## 2023-09-26 DIAGNOSIS — M2550 Pain in unspecified joint: Secondary | ICD-10-CM | POA: Diagnosis not present

## 2023-09-26 DIAGNOSIS — E785 Hyperlipidemia, unspecified: Secondary | ICD-10-CM | POA: Diagnosis not present

## 2023-09-26 MED ORDER — ROSUVASTATIN CALCIUM 5 MG PO TABS
5.0000 mg | ORAL_TABLET | Freq: Every day | ORAL | 3 refills | Status: DC
Start: 2023-09-26 — End: 2024-03-27

## 2023-09-26 MED ORDER — ZOLPIDEM TARTRATE ER 12.5 MG PO TBCR
12.5000 mg | EXTENDED_RELEASE_TABLET | Freq: Every evening | ORAL | 0 refills | Status: DC | PRN
Start: 2023-09-26 — End: 2024-03-27

## 2023-09-26 NOTE — Assessment & Plan Note (Signed)
Problem is well controlled. Continue Ambien CR 12.5 mg at bedtime as needed and good sleep hygiene. Will continue 3 months supply because it helps with cost. F/U in 6 months.

## 2023-09-26 NOTE — Assessment & Plan Note (Signed)
Last LDL 96 in 04/2023. Continue Rosuvastatin 5 mg daily and low fat diet. We can repeat FLP in 6 months.

## 2023-09-26 NOTE — Assessment & Plan Note (Addendum)
BP adequately controlled. Continue Losartan 50 mg daily and low salt/DASH diet. Continue monitoring BP regularly. Follow up in 6 month.

## 2023-09-26 NOTE — Assessment & Plan Note (Signed)
Problem is not well controlled. She was not able to re-establish with [facility]. She is concerned about possible autoimmune GI process and requesting referral to Dr. Lanell Matar. Continue Protonix 40 mg bid and GERD precautions.

## 2023-09-26 NOTE — Progress Notes (Signed)
HPI: [name] is a 57 y.o. female with a PMHx significant for HTN, HLD, Latent autoimmune DM, hypothyroidism, insomnia, GERD, and myalgia, who is here today for chronic disease management.   Last seen on 07/15/2023 She has followed with endocrinology since her last visit who is managing her DM I, B12 deficiency, and hypothyroidism.  She has had difficulty finding a gastroenterologist recently, tried to reestablish with former GI, not able to do so. Intermittent heartburn and acid reflux, sleeps with elevated head bed.  She is on Protonix 40 mg bid. Has seen Dr [name] but trying to establish with a provider in town.  Hypertension:  Medications: Currently on losartan 50 mg daily.  BP readings at home: She has been checking at home. She says her readings have been normal.  Negative for unusual or severe headache, visual changes, exertional chest pain, dyspnea,  focal weakness, or edema.  Lab Results  Component Value Date   CREATININE 0.6 05/20/2023   BUN 11 08/04/2022   NA 135 (A) 05/20/2023   K 4.1 05/20/2023   CL 103 08/04/2022   CO2 27 08/04/2022   Hyperlipidemia: Currently on rosuvastatin 5 mg daily.  Lab Results  Component Value Date   CHOL 196 05/20/2023   HDL 49 05/20/2023   LDLCALC 96 05/20/2023   TRIG 115 05/20/2023   CHOLHDL 5 06/29/2022   Insomnia: She is currently taking Ambien CR 12.5 mg daily prn.  She notes she has had some improvement and says she is now sleeping 6-7 hours per night.   Myalgia and arthralgias: She notes the burning and extremities tingling/numbness sensation has improved with gabapentin 300 mg daily.  She reports she still has joint pain in her right knee, right elbow, toes of the left foot, and shoulders. She endorses chronic fatigue. Expresses concerned about systemic immunologic disorder affecting different symptoms.Requesting referral to rheumatologist to explore the possibility of starting immunomodulator agent. No known Fhx  of lupus or RA. Mother with hx of arthralgias, she has not seen rheumatologist. 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  Lab Results  Component Value Date   ESRSEDRATE 16 05/20/2023   She also complains of a recent gum inflammation while she was on a trip in [place]. She says she could not talk or eat. She saw an oral pathologist in [place] who diagnosed her with autoimmune "lichen planus."  Triamcinolone paste was recommended, waiting to have Rx filled. She will be returning to [place] for him to do a biopsy.   Review of Systems  Constitutional:  Positive for fatigue. Negative for activity change, appetite change, chills and fever.  HENT:  Negative for sore throat.   Respiratory:  Negative for cough and wheezing.   Gastrointestinal:  Negative for abdominal pain, nausea and vomiting.  Endocrine: Negative for cold intolerance and heat intolerance.  Genitourinary:  Negative for decreased urine volume, dysuria and hematuria.  Musculoskeletal:  Positive for arthralgias, back pain and myalgias. Negative for joint swelling.  Skin:  Negative for rash.  Neurological:  Negative for syncope and facial asymmetry.  Psychiatric/Behavioral:  Negative for confusion. The patient is nervous/anxious.   See other pertinent positives and negatives in HPI.  Current Outpatient Medications on File Prior to Visit  Medication Sig Dispense Refill   albuterol (VENTOLIN HFA) 108 (90 Base) MCG/ACT inhaler Inhale 2 puffs into the lungs every 6 (six) hours as needed for wheezing or shortness of breath. 8 g 0  Calcium Carb-Cholecalciferol (CALCIUM 1000 + D) 1000-800 MG-UNIT TABS Take 1 tablet by mouth as needed.     cholecalciferol (VITAMIN D3) 25 MCG (1000 UNIT) tablet Take 1 tablet by mouth. Three times a week as needed     cyanocobalamin (VITAMIN B12) 1000 MCG/ML injection Inject 1 mL (1,000 mcg total) into the muscle every 30 (thirty) days. 3 mL 2   estradiol (ESTRACE) 1  MG tablet Take 0.5 mg by mouth daily.     gabapentin (NEURONTIN) 300 MG capsule Take 1 capsule (300 mg total) by mouth at bedtime. 30 capsule 1   insulin aspart (FIASP FLEXTOUCH) 100 UNIT/ML FlexTouch Pen Inject 4 U in the morning, 5 U at lunch, and 4 U in the evening.     insulin glargine (LANTUS SOLOSTAR) 100 UNIT/ML Solostar Pen Inject 5 Units into the skin at bedtime.     levothyroxine (SYNTHROID) 88 MCG tablet 1/2 tab Tuesdays and Thursdays. 1 tab the rest of days 180 tablet 0   losartan (COZAAR) 50 MG tablet Take 1 tablet (50 mg total) by mouth daily. 90 tablet 1   pantoprazole (PROTONIX) 40 MG tablet Take 40 mg by mouth 2 (two) times daily.     sucralfate (CARAFATE) 1 g tablet Take 1 tablet (1 g total) by mouth 4 (four) times daily -  with meals and at bedtime. 120 tablet 2   Teriparatide, Recombinant, (FORTEO) 600 MCG/2.4ML SOPN Inject into the skin daily.     No current facility-administered medications on file prior to visit.    Past Medical History:  Diagnosis Date   Fibromyalgia    GERD (gastroesophageal reflux disease)    HLD (hyperlipidemia)    Hypoglycemia    Hypothyroidism    Insomnia    Prediabetes    Allergies  Allergen Reactions   Lexapro [Escitalopram] Itching   Other     Patient does not want any narcotics- makes her itch.   Soma [Carisoprodol] Itching   Tramadol Itching    Ultram    Social History   Socioeconomic History   Marital status: Single    Spouse name: Not on file   Number of children: 0   Years of education: Not on file   Highest education level: Not on file  Occupational History   Occupation: retired  Tobacco Use   Smoking status: Never   Smokeless tobacco: Never  Vaping Use   Vaping status: Never Used  Substance and Sexual Activity   Alcohol use: Not Currently   Drug use: Never   Sexual activity: Not on file  Other Topics Concern   Not on file  Social History Narrative   Not on file   Social Determinants of Health    Financial Resource Strain: Low Risk  (08/07/2021)   Received from [facility], [facility]   Overall Financial Resource Strain (CARDIA)    Difficulty of Paying Living Expenses: Not hard at all  Food Insecurity: No Food Insecurity (08/07/2021)   Received from [facility], [facility]   Hunger Vital Sign    Worried About Running Out of Food in the Last Year: Never true    Ran Out of Food in the Last Year: Never true  Transportation Needs: No Transportation Needs (08/07/2021)   Received from [facility], [facility]   PRAPARE - Transportation    Lack of Transportation (Medical): No    Lack of Transportation (Non-Medical): No  Physical Activity: Inactive (08/07/2021)   Received from [facility], [facility]   Exercise  Vital Sign    Days of Exercise per Week: 0 days    Minutes of Exercise per Session: 0 min  Stress: No Stress Concern Present (08/07/2021)   Received from [facility], [facility] of Occupational Health - Occupational Stress Questionnaire    Feeling of Stress : Not at all  Social Connections: Unknown (04/26/2022)   Received from [facility], [facility]   Social Network    Social Network: Not on file   Vitals:   09/26/23 1353  BP: 110/74  Pulse: 82  Resp: 16  SpO2: 100%   Wt Readings from Last 3 Encounters:  09/26/23 100 lb 8 oz (45.6 kg)  07/15/23 98 lb 4 oz (44.6 kg)  11/23/22 90 lb 4 oz (40.9 kg)   Body mass index is 20.3 kg/m.  Physical Exam Vitals and nursing note reviewed.  Constitutional:      General: She is not in acute distress.    Appearance: She is well-developed.  HENT:     Head: Normocephalic and atraumatic.     Mouth/Throat:     Mouth: Mucous membranes are moist.     Pharynx: Oropharynx is clear.  Eyes:     Conjunctiva/sclera: Conjunctivae normal.  Cardiovascular:     Rate and Rhythm: Normal rate and regular rhythm.     Pulses:          Dorsalis pedis pulses are 2+ on the right side and 2+ on  the left side.     Heart sounds: No murmur heard. Pulmonary:     Effort: Pulmonary effort is normal. No respiratory distress.     Breath sounds: Normal breath sounds.  Abdominal:     Palpations: Abdomen is soft. There is no mass.     Tenderness: There is no abdominal tenderness.  Musculoskeletal:     Comments: No signs of synovitis.  Skin:    General: Skin is warm.     Findings: No erythema or rash.  Neurological:     General: No focal deficit present.     Mental Status: She is alert and oriented to person, place, and time.     Cranial Nerves: No cranial nerve deficit.     Gait: Gait normal.  Psychiatric:        Mood and Affect: Affect normal. Mood is anxious.   ASSESSMENT AND PLAN:  Ms. Cofield was seen today for chronic disease management.  Orders Placed This Encounter  Procedures   Flu vaccine trivalent PF, 6mos and older(Flulaval,Afluria,Fluarix,Fluzone)   Ambulatory referral to Gastroenterology   Ambulatory referral to Rheumatology   Insomnia, unspecified type Assessment & Plan: Problem is well controlled. Continue Ambien CR 12.5 mg at bedtime as needed and good sleep hygiene. Will continue 3 months supply because it helps with cost. F/U in 6 months.  Orders: -     Zolpidem Tartrate ER; Take 1 tablet (12.5 mg total) by mouth at bedtime as needed. for sleep  Dispense: 90 tablet; Refill: 0  Myalgia Assessment & Plan: Mild improvement after resuming Gabapentin 300 mg at bedtime. ? Fibromyalgia. No changes today. Rheumatology referral placed as requested.   Essential (primary) hypertension Assessment & Plan: BP adequately controlled. Continue Losartan 50 mg daily and low salt/DASH diet. Continue monitoring BP regularly. Follow up in 6 month.   Gastroesophageal reflux disease, unspecified whether esophagitis present Assessment & Plan: Problem is not well controlled. She was not able to re-establish with [facility]. She is concerned about possible autoimmune  GI  process and requesting referral to Dr. Lanell Matar. Continue Protonix 40 mg bid and GERD precautions.  Orders: -     Ambulatory referral to Gastroenterology  Polyarthralgia Assessment & Plan: ? OA. Concerned about possible serious rheumatologic disorder. Requesting referral to Dr Ang, referral placed.  Orders: -     Ambulatory referral to Rheumatology  Hyperlipidemia, unspecified hyperlipidemia type Assessment & Plan: Last LDL 96 in 04/2023. Continue Rosuvastatin 5 mg daily and low fat diet. We can repeat FLP in 6 months.  Orders: -     Rosuvastatin Calcium; Take 1 tablet (5 mg total) by mouth daily.  Dispense: 90 tablet; Refill: 3  Need for influenza vaccination -     Flu vaccine trivalent PF, 6mos and older(Flulaval,Afluria,Fluarix,Fluzone)   I spent a total of 44 minutes in both face to face and non face to face activities for this visit on the date of this encounter. During this time history was obtained and documented, examination was performed, prior labs reviewed, and assessment/plan discussed.  Return in about 6 months (around 03/26/2024) for CPE.  I, Suanne Marker, acting as a scribe for Alycea Segoviano [place], MD., have documented all relevant documentation on the behalf of Janos Shampine [place], MD, as directed by  Rashonda Warrior [place], MD while in the presence of Palmer Shorey [place], MD.   I, Ladean Steinmeyer [place], MD, have reviewed all documentation for this visit. The documentation on 09/26/23 for the exam, diagnosis, procedures, and orders are all accurate and complete.  Deshan Hemmelgarn G. [place], MD  [facility]. [place] office.

## 2023-09-26 NOTE — Assessment & Plan Note (Signed)
?   OA. Concerned about possible serious rheumatologic disorder. Requesting referral to Dr Ang, referral placed.

## 2023-09-26 NOTE — Patient Instructions (Addendum)
A few things to remember from today's visit:  Essential (primary) hypertension  Insomnia, unspecified type  Myalgia  Gastroesophageal reflux disease, unspecified whether esophagitis present - Plan: Ambulatory referral to Gastroenterology  Polyarthralgia - Plan: Ambulatory referral to Rheumatology  Hyperlipidemia, unspecified hyperlipidemia type - Plan: rosuvastatin (CRESTOR) 5 MG tablet  No changes today.  If you need refills for medications you take chronically, please call your pharmacy. Do not use My Chart to request refills or for acute issues that need immediate attention. If you send a my chart message, it may take a few days to be addressed, specially if I am not in the office.  Please be sure medication list is accurate. If a new problem present, please set up appointment sooner than planned today.

## 2023-09-26 NOTE — Assessment & Plan Note (Signed)
Mild improvement after resuming Gabapentin 300 mg at bedtime. ? Fibromyalgia. No changes today. Rheumatology referral placed as requested.

## 2023-10-20 DIAGNOSIS — H16223 Keratoconjunctivitis sicca, not specified as Sjogren's, bilateral: Secondary | ICD-10-CM | POA: Diagnosis not present

## 2023-10-20 DIAGNOSIS — E119 Type 2 diabetes mellitus without complications: Secondary | ICD-10-CM | POA: Diagnosis not present

## 2023-10-20 DIAGNOSIS — H01004 Unspecified blepharitis left upper eyelid: Secondary | ICD-10-CM | POA: Diagnosis not present

## 2023-10-20 DIAGNOSIS — H01001 Unspecified blepharitis right upper eyelid: Secondary | ICD-10-CM | POA: Diagnosis not present

## 2023-11-11 ENCOUNTER — Telehealth: Payer: Self-pay | Admitting: Family Medicine

## 2023-11-11 NOTE — Telephone Encounter (Signed)
Pt says provider for referral [phone] says they do not have the referral and requesting it be resent

## 2023-11-15 ENCOUNTER — Encounter: Payer: Self-pay | Admitting: Family Medicine

## 2023-11-15 DIAGNOSIS — K219 Gastro-esophageal reflux disease without esophagitis: Secondary | ICD-10-CM

## 2023-11-22 NOTE — Telephone Encounter (Signed)
Resent referral  

## 2024-03-27 ENCOUNTER — Encounter: Payer: Self-pay | Admitting: Family Medicine

## 2024-03-27 ENCOUNTER — Ambulatory Visit (INDEPENDENT_AMBULATORY_CARE_PROVIDER_SITE_OTHER): Admitting: Family Medicine

## 2024-03-27 VITALS — BP 112/80 | HR 88 | Temp 98.5°F | Resp 16 | Ht 59.0 in | Wt 98.4 lb

## 2024-03-27 DIAGNOSIS — R1010 Upper abdominal pain, unspecified: Secondary | ICD-10-CM

## 2024-03-27 DIAGNOSIS — R0602 Shortness of breath: Secondary | ICD-10-CM

## 2024-03-27 DIAGNOSIS — K219 Gastro-esophageal reflux disease without esophagitis: Secondary | ICD-10-CM | POA: Diagnosis not present

## 2024-03-27 DIAGNOSIS — L439 Lichen planus, unspecified: Secondary | ICD-10-CM | POA: Diagnosis not present

## 2024-03-27 DIAGNOSIS — I10 Essential (primary) hypertension: Secondary | ICD-10-CM | POA: Diagnosis not present

## 2024-03-27 DIAGNOSIS — G4700 Insomnia, unspecified: Secondary | ICD-10-CM

## 2024-03-27 DIAGNOSIS — R970 Elevated carcinoembryonic antigen [CEA]: Secondary | ICD-10-CM

## 2024-03-27 DIAGNOSIS — E785 Hyperlipidemia, unspecified: Secondary | ICD-10-CM | POA: Diagnosis not present

## 2024-03-27 MED ORDER — ZOLPIDEM TARTRATE ER 12.5 MG PO TBCR
12.5000 mg | EXTENDED_RELEASE_TABLET | Freq: Every evening | ORAL | 0 refills | Status: DC | PRN
Start: 2024-03-27 — End: 2024-07-23

## 2024-03-27 MED ORDER — LOSARTAN POTASSIUM 50 MG PO TABS: 50.0000 mg | ORAL_TABLET | Freq: Every day | ORAL | 2 refills | Status: AC

## 2024-03-27 NOTE — Assessment & Plan Note (Addendum)
 She reports that she was diagnosed in [place] by dentist.  Currently she is on Xeljanz 5 mg daily, which has really helped. She has an appt with rheumatologist on 03/30/24, she is planning on requesting prescription to continue treatment, otherwise she needs to establish with dermatologist. Dermatology referral placed as requested.

## 2024-03-27 NOTE — Patient Instructions (Addendum)
 A few things to remember from today's visit:  Gastroesophageal reflux disease, unspecified whether esophagitis present  Insomnia, unspecified type - Plan: zolpidem (AMBIEN CR) 12.5 MG CR tablet  Lichen planus - Plan: Ambulatory referral to Dermatology  Elevated CEA  Essential (primary) hypertension - Plan: losartan (COZAAR) 50 MG tablet  SOB (shortness of breath) No changes today. We can re-check CEA with next blood work. Monitor for new symptoms or worsening shortness of breath.  If you need refills for medications you take chronically, please call your pharmacy. Do not use My Chart to request refills or for acute issues that need immediate attention. If you send a my chart message, it may take a few days to be addressed, specially if I am not in the office.  Please be sure medication list is accurate. If a new problem present, please set up appointment sooner than planned today.

## 2024-03-27 NOTE — Assessment & Plan Note (Addendum)
 Problem is not well-controlled. She has had EGD x 3. According to patient, gastroenterologist in [place] Dx'ed autoimmune gastritis recommended to avoid PPI. 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 She has tried Sucralfate, not effective. Continue Pepcid 40 mg daily. Continue GERD precautions. She has an appointment with new GI in 05/2024.

## 2024-03-27 NOTE — Assessment & Plan Note (Signed)
 In general she feels like medication is still helping. Continue Ambien CR 12.5 mg daily at bedtime as needed and adequate sleep hygiene.

## 2024-03-27 NOTE — Assessment & Plan Note (Signed)
 Currently she is on rosuvastatin 10 mg daily, dose was increased about 3 months ago while she was in [place]. LDL on 02/21/2024 was 86. No changes in current management.

## 2024-03-27 NOTE — Progress Notes (Signed)
 ACUTE VISIT Chief Complaint  Patient presents with   Abdominal Pain    Ache & burns lower abdomen    HPI: [name] is a 58 y.o. female with a PMHx significant for HTN, HLD, latent autoimmune DM, hypothyroidism, insomnia, GERD, and myalgia, who is here today complaining of abdominal pain.   Abdominal pain:  Patient complains of a burning sensation in her upper abdomen for 2-3 weeks and bloating sensation. Also endorses headaches due to frequent burping,  She saw a gastroenterologist in [place] and has an appointment with one here in 05/2024. In the visit in [place], she was put on Famotidine 40 mg daily and told not to take PPIs because it would aggravate her autoimmune gastritis. Also put on Xifaxan at that time, which has helped with bloating sensation and burping but symptoms have not resolved. Today is her last day on the Xifaxan.   EGD 06/2022 Gastritis with Bx that showed MILDLY ACTIVE CHRONIC GASTRITIS WITH FOCAL INTESTINAL METAPLASIA, AND MILD ENTEROCHROMMAFIN-LIKE CELL HYPERPLASIA, CONSISTENT WITH AUTOIMMUNE METAPLASTIC ATROPHIC GASTRITIS, SEE NOTE. - HELICOBACTER PYLORI IMMUNOHISTOCHEMICAL (IHC) STAIN NEGATIVE FOR ORGANISMS, WITH ADEQUATE CONTROLS. - CHROMOGRANIN IHC STAIN WITH ADEQUATE CONTROLS. - NEGATIVE FOR MALIGNANCY. She was referred to Dr Cleda Clarks, GI at [facility], 09/29/22.  Insomnia:  Currently on Ambien 12.5 mg one half tablet while she was in [place], did not want to run out of medication. She sleeps better with the medication, but says she is still not able to sleep through the night when taking 1/2 tab. She has tried Ambien 10 mg before.   Lichen Planus:  She saw a dermatologist in [place] and started on [place] 5 mg daily. She says the burning and redness of her gums, as well as the lesions in her mouth have improved.  She denies difficulty swallowing.  She also has an appointment with rheumatology on 4/11 and hoping provider will continue treating  condition.  Since started on Harriette Ohara she has noted that myalgias and arthralgias have improved, so she is not longer taking Gabapentin.  Hyperlipemia:  She also had her rosuvastatin increased from 5 to 10 mg daily.   SOB/Calves pain:  Patient also complains of slightly SOB and calves pain when she walks for extended periods for about 3 weeks.  Denies swelling or redness in her calves, cough, wheezing, or chest pain.   HTN on Losartan 50 mg daily.  3/4 labs in [place]:   Fasting Insulin: 1.92 HbA1c: 6.3 Bilirubin total: 1.58 Bilirubin Direct: 0.76 Total Protein: 6.50 Albumin/Gloulin Ratio: 2.23 CEA: 2.88 FSH: 121 Hemoglobin: 11.4 RBC: 3.86 PCV: 35.4 LDL/HDL ratio: 1.52 Cholesterol / HDL ratio: 2.77 TSH: 4.63  CA 125 (Serum, Electrochemiluminescence immunoassay): 14.9  Fasting Glucose: 94 Glucose Post Prandial: 112 Estimated Average Glucose: 134.11  Albumin, Serum: 4.49 Globulin: 2.01 SGPT (ALT): 17 SGOT (AST): 21 Alkaline Phosphatase, Serum: 56.2 LDH, serum: 184  Gamma GT (GGTP): 12 Albumin-Microalbumin/Creatinine Ratio: 4.25 Albumin/Microalbumin in Urine: 5 Creatinine, Urine: 117.52  GFR with creatinine: 0.61  BUN, serum: 13.1 Uric Acid: 3.8 HsCRP: 0.3  Homocysteine: 8.65  Apolipoproteins A1: 146 Apolipoproteins A2: 80.1 Apolipoprotein B/A1 ratio: 0.55  Phosphorus, serum: 3.7 Calcium: serum: 8.9 CEA, serum: 2.88  IgE Total Antibody: 35.4  Cortisol- 8 am: 17.2 Lipase, serum 38.3 RA Factor: below 10  Amylase, serum: 89  CBC:  MCV: 91.8 MCH: 29.6 MCHC: 32.2 RDW: 13.6 WBC: 4,600 Absolute Neutrophils: 2392 Absolute Lymphocyte: 1702 Absolute Monocyte: 460 Absolute Eosinophil: 46 Absolute Basophil: 0 Neutrophils: 52 Lymphocytes: 37  Monocytes: 10 Eosinophils: 1 Basophils:0 Platelet count: 259 MPV: 8.4  B12: 621 CPK total: 97.9  ESR: 6  Iron, serum: 114 UIBC: 152 TIBC: 266 Transferin Saturation (calculated): 43  Vitamin D  total - 25 hydroxy: 39.3  Total cholesterol: 157 Triglycerides: 70.9 HDL: 56.7 Non HDL cholesterol: 100.3 LDL: 86.12 VLDL: 14.18 LDL/HDL ratio: 6.04 Cholesterol/HDL ratio: 2.77  Sodium: 143 Potassium: 4.1 Chloride: 107  FT3 (Free Triiodothyronine): 2.69 FT4 (Free Thyroxine): 1.37 TSH: 4.63  Review of Systems  Constitutional:  Negative for activity change, appetite change, chills and fever.  HENT:  Negative for mouth sores and sore throat.   Endocrine: Negative for cold intolerance and heat intolerance.  Genitourinary:  Negative for decreased urine volume, dysuria and hematuria.  Skin:  Negative for rash.  Neurological:  Negative for syncope, facial asymmetry and weakness.  Psychiatric/Behavioral:  Negative for confusion and hallucinations.   See other pertinent positives and negatives in HPI.  Current Outpatient Medications on File Prior to Visit  Medication Sig Dispense Refill   albuterol (VENTOLIN HFA) 108 (90 Base) MCG/ACT inhaler Inhale 2 puffs into the lungs every 6 (six) hours as needed for wheezing or shortness of breath. 8 g 0   Calcium Carb-Cholecalciferol (CALCIUM 1000 + D) 1000-800 MG-UNIT TABS Take 1 tablet by mouth as needed.     cholecalciferol (VITAMIN D3) 25 MCG (1000 UNIT) tablet Take 1 tablet by mouth. Three times a week as needed     cyanocobalamin (VITAMIN B12) 1000 MCG/ML injection Inject 1 mL (1,000 mcg total) into the muscle every 30 (thirty) days. 3 mL 2   estradiol (ESTRACE) 1 MG tablet Take 0.5 mg by mouth daily.     insulin aspart (FIASP FLEXTOUCH) 100 UNIT/ML FlexTouch Pen Inject 4 U in the morning, 5 U at lunch, and 4 U in the evening.     insulin degludec (TRESIBA) 100 UNIT/ML FlexTouch Pen Inject 5 Units into the skin at bedtime.     levothyroxine (SYNTHROID) 88 MCG tablet 1/2 tab Tuesdays and Thursdays. 1 tab the rest of days 180 tablet 0   rosuvastatin (CRESTOR) 10 MG tablet Take 10 mg by mouth daily.     Teriparatide, Recombinant, (FORTEO) 600  MCG/2.4ML SOPN Inject into the skin daily.     Tofacitinib Citrate (XELJANZ) 5 MG TABS Take 1 tablet by mouth daily at 12 noon.     No current facility-administered medications on file prior to visit.   Past Medical History:  Diagnosis Date   Fibromyalgia    GERD (gastroesophageal reflux disease)    HLD (hyperlipidemia)    Hypoglycemia    Hypothyroidism    Insomnia    Prediabetes    Allergies  Allergen Reactions   Lexapro [Escitalopram] Itching   Other     Patient does not want any narcotics- makes her itch.   Soma [Carisoprodol] Itching   Tramadol Itching    Ultram    Social History   Socioeconomic History   Marital status: Single    Spouse name: Not on file   Number of children: 0   Years of education: Not on file   Highest education level: Not on file  Occupational History   Occupation: retired  Tobacco Use   Smoking status: Never   Smokeless tobacco: Never  Vaping Use   Vaping status: Never Used  Substance and Sexual Activity   Alcohol use: Not Currently   Drug use: Never   Sexual activity: Not on file  Other Topics Concern   Not  on file  Social History Narrative   Not on file   Social Drivers of Health   Financial Resource Strain: Low Risk  (08/07/2021)   Received from [facility], [facility]   Overall Financial Resource Strain (CARDIA)    Difficulty of Paying Living Expenses: Not hard at all  Food Insecurity: No Food Insecurity (08/07/2021)   Received from [facility], [facility]   Hunger Vital Sign    Worried About Running Out of Food in the Last Year: Never true    Ran Out of Food in the Last Year: Never true  Transportation Needs: No Transportation Needs (08/07/2021)   Received from [facility], [facility]   PRAPARE - Transportation    Lack of Transportation (Medical): No    Lack of Transportation (Non-Medical): No  Physical Activity: Inactive (08/07/2021)   Received from [facility], [facility]   Exercise Vital Sign    Days  of Exercise per Week: 0 days    Minutes of Exercise per Session: 0 min  Stress: No Stress Concern Present (08/07/2021)   Received from [facility], [facility] - Occupational Stress Questionnaire    Feeling of Stress : Not at all  Social Connections: Unknown (04/26/2022)   Received from [facility], [facility]   Social Network    Social Network: Not on file    Vitals:   03/27/24 1131  BP: 112/80  Pulse: 88  Resp: 16  Temp: 98.5 F (36.9 C)  SpO2: 98%   Body mass index is 19.87 kg/m.  Physical Exam Vitals and nursing note reviewed.  Constitutional:      General: She is not in acute distress.    Appearance: She is well-developed.  HENT:     Head: Normocephalic and atraumatic.     Mouth/Throat:     Mouth: Mucous membranes are moist.     Pharynx: Oropharynx is clear. Uvula midline.  Eyes:     Conjunctiva/sclera: Conjunctivae normal.  Cardiovascular:     Rate and Rhythm: Normal rate and regular rhythm.     Pulses:          Dorsalis pedis pulses are 2+ on the right side and 2+ on the left side.     Heart sounds: No murmur heard.    Comments: No calves tenderness with palpation. Pulmonary:     Effort: Pulmonary effort is normal. No respiratory distress.     Breath sounds: Normal breath sounds.  Abdominal:     Palpations: Abdomen is soft. There is no hepatomegaly or mass.     Tenderness: There is no abdominal tenderness.  Musculoskeletal:     Right lower leg: No swelling. No edema.     Left lower leg: No swelling. No edema.  Lymphadenopathy:     Cervical: No cervical adenopathy.  Skin:    General: Skin is warm.     Findings: No erythema or rash.  Neurological:     General: No focal deficit present.     Mental Status: She is alert and oriented to person, place, and time.     Cranial Nerves: No cranial nerve deficit.     Gait: Gait normal.  Psychiatric:        Mood and Affect: Affect normal. Mood is anxious.     ASSESSMENT AND PLAN:  [name] was seen today for abdominal pain.   Insomnia, unspecified type Assessment & Plan: In general she feels like medication is still helping. Continue Ambien  CR 12.5 mg daily at bedtime as needed and adequate sleep hygiene.  Orders: -     Zolpidem Tartrate ER; Take 1 tablet (12.5 mg total) by mouth at bedtime as needed. for sleep  Dispense: 90 tablet; Refill: 0  SOB (shortness of breath) We discussed possible etiologies. Examination today is not suggestive of a serious process. We have X ray service available, she is concerned about radiation exposure,so would like to hold on imaging for now. Monitor for changes. Instructed about warning signs. F/U in 2 months.  Gastroesophageal reflux disease, unspecified whether esophagitis present Assessment & Plan: Problem is not well-controlled. She has had EGD x 3. According to patient, gastroenterologist in [place] Dx'ed autoimmune gastritis recommended to avoid PPI. 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 She has tried Sucralfate, not effective. Continue Pepcid 40 mg daily. Continue GERD precautions. She has an appointment with new GI in 05/2024.   Lichen planus Assessment & Plan: She reports that she was diagnosed in [place] by dentist.  Currently she is on Xeljanz 5 mg daily, which has really helped. She has an appt with rheumatologist on 03/30/24, she is planning on requesting prescription to continue treatment, otherwise she needs to establish with dermatologist. Dermatology referral placed as requested.  Orders: -     Ambulatory referral to Dermatology  Elevated CEA S/P hysterectomy. This test was done in [place] as part of a "package."  We discussed possible causes. Hypothyroidism and chronic GI problems could cause elevated CEA. Colonoscopy up to date. Due for mammogram,  last one I can see on 10/06/21: Bi-Rads 1.  We can plan on repeating lab in 2 months and final recommendation will be given according to lab results.  Essential (primary) hypertension Assessment & Plan: BP adequately controlled. Continue losartan 50 mg daily and low-salt diet.  Orders: -     Losartan Potassium; Take 1 tablet (50 mg total) by mouth daily.  Dispense: 90 tablet; Refill: 2  Hyperlipidemia, unspecified hyperlipidemia type Assessment & Plan: Currently she is on rosuvastatin 10 mg daily, dose was increased about 3 months ago while she was in [place]. LDL on 02/21/2024 was 86. No changes in current management.  Upper abdominal pain: This is a chronic problems. She has seen a few gastroenterologist, planning on establishing with new GI in 6/205. Colonoscopy 05/01/20 and EGD x3 Abdominal CT 05/01/20:Negative. MRI enterography abdomen/pelvis due to abdominal pain and N/V: No explanation identified for the patient's symptoms.   She has been treated for small intestinal bacteria overgrowth (SIBO), positive breathing test in 12/01/20. During recent trip to [place] she was started on Rifaximin by her GI. She has an appt with GI on 05/29/24 It has helped with GI symptoms but still feeling bloated and burping frequently.  I spent a total of 55 minutes in both face to face and non face to face activities for this visit on the date of this encounter. During this time history was obtained and documented, examination was performed, prior labs/imaging reviewed, and assessment/plan discussed.  Return in about 2 months (around 05/27/2024).  I, Rolla Etienne Wierda, acting as a scribe for Davied Nocito [place], MD., have documented all relevant documentation on the behalf of Marisa Whiters [place], MD, as directed by  Coraima Tibbs [place], MD while in the presence of Clemence Stillings [place], MD.   I, Margery Szostak [place], MD, have reviewed all documentation for this visit. The documentation on 03/27/24 for the exam, diagnosis, procedures, and  orders  are all accurate and complete.  Shakeya Kerkman G. [place], MD  [facility]. [place] office.

## 2024-03-27 NOTE — Assessment & Plan Note (Signed)
BP adequately controlled. Continue losartan 50 mg daily and low-salt diet.

## 2024-03-30 DIAGNOSIS — M797 Fibromyalgia: Secondary | ICD-10-CM | POA: Diagnosis not present

## 2024-03-30 DIAGNOSIS — M3500 Sicca syndrome, unspecified: Secondary | ICD-10-CM | POA: Diagnosis not present

## 2024-03-30 DIAGNOSIS — L439 Lichen planus, unspecified: Secondary | ICD-10-CM | POA: Diagnosis not present

## 2024-03-30 DIAGNOSIS — M2550 Pain in unspecified joint: Secondary | ICD-10-CM | POA: Diagnosis not present

## 2024-04-07 ENCOUNTER — Other Ambulatory Visit: Payer: Self-pay | Admitting: Family Medicine

## 2024-04-07 DIAGNOSIS — K2940 Chronic atrophic gastritis without bleeding: Secondary | ICD-10-CM

## 2024-04-09 ENCOUNTER — Other Ambulatory Visit (HOSPITAL_COMMUNITY): Payer: Self-pay

## 2024-04-09 ENCOUNTER — Telehealth: Payer: Self-pay | Admitting: Pharmacy Technician

## 2024-04-09 NOTE — Telephone Encounter (Signed)
 Pharmacy Patient Advocate Encounter   Received notification from CoverMyMeds that prior authorization for Zolpidem  Tartrate ER 12.5MG  er tablets is required/requested.   Insurance verification completed.   The patient is insured through [facility] .   Per test claim: PA required; PA submitted to above mentioned insurance via CoverMyMeds Key/confirmation #/EOC [id number] Status is pending

## 2024-04-16 DIAGNOSIS — E109 Type 1 diabetes mellitus without complications: Secondary | ICD-10-CM | POA: Insufficient documentation

## 2024-04-16 DIAGNOSIS — M810 Age-related osteoporosis without current pathological fracture: Secondary | ICD-10-CM | POA: Insufficient documentation

## 2024-04-16 DIAGNOSIS — E039 Hypothyroidism, unspecified: Secondary | ICD-10-CM | POA: Diagnosis not present

## 2024-04-16 DIAGNOSIS — E782 Mixed hyperlipidemia: Secondary | ICD-10-CM | POA: Diagnosis not present

## 2024-04-16 DIAGNOSIS — E559 Vitamin D deficiency, unspecified: Secondary | ICD-10-CM | POA: Diagnosis not present

## 2024-04-16 DIAGNOSIS — E538 Deficiency of other specified B group vitamins: Secondary | ICD-10-CM | POA: Diagnosis not present

## 2024-04-18 ENCOUNTER — Other Ambulatory Visit (HOSPITAL_COMMUNITY): Payer: Self-pay

## 2024-04-18 NOTE — Telephone Encounter (Signed)
 Pharmacy Patient Advocate Encounter  Received notification from [facility] that Prior Authorization for Zolpidem  Tartrate ER 12.5MG  er tablets has been APPROVED from 04/09/24 to 04/09/25. Ran test claim, Copay is $8.48. This test claim was processed through [facility]- copay amounts may vary at other pharmacies due to pharmacy/plan contracts, or as the patient moves through the different stages of their insurance plan.   PA #/Case ID/Reference #: [id number]

## 2024-04-27 ENCOUNTER — Ambulatory Visit: Admitting: Family Medicine

## 2024-04-30 ENCOUNTER — Encounter: Payer: Self-pay | Admitting: Family Medicine

## 2024-04-30 ENCOUNTER — Ambulatory Visit (INDEPENDENT_AMBULATORY_CARE_PROVIDER_SITE_OTHER): Admitting: Family Medicine

## 2024-04-30 VITALS — BP 118/70 | HR 105 | Resp 12 | Ht 59.0 in | Wt 98.0 lb

## 2024-04-30 DIAGNOSIS — K219 Gastro-esophageal reflux disease without esophagitis: Secondary | ICD-10-CM

## 2024-04-30 DIAGNOSIS — R070 Pain in throat: Secondary | ICD-10-CM

## 2024-04-30 DIAGNOSIS — M797 Fibromyalgia: Secondary | ICD-10-CM | POA: Diagnosis not present

## 2024-04-30 DIAGNOSIS — E785 Hyperlipidemia, unspecified: Secondary | ICD-10-CM | POA: Diagnosis not present

## 2024-04-30 DIAGNOSIS — D510 Vitamin B12 deficiency anemia due to intrinsic factor deficiency: Secondary | ICD-10-CM

## 2024-04-30 DIAGNOSIS — R0683 Snoring: Secondary | ICD-10-CM

## 2024-04-30 MED ORDER — GABAPENTIN 100 MG PO CAPS: 200.0000 mg | ORAL_CAPSULE | Freq: Every day | ORAL | 2 refills | Status: DC

## 2024-04-30 MED ORDER — SUCRALFATE 1 G PO TABS: 1.0000 g | ORAL_TABLET | Freq: Three times a day (TID) | ORAL | 0 refills | Status: DC

## 2024-04-30 MED ORDER — ROSUVASTATIN CALCIUM 10 MG PO TABS: 10.0000 mg | ORAL_TABLET | Freq: Every day | ORAL | 3 refills | Status: AC

## 2024-04-30 NOTE — Assessment & Plan Note (Signed)
 LDL improved, it went from 94 to 71. Continue rosuvastatin  10 mg daily and low-fat diet.

## 2024-04-30 NOTE — Assessment & Plan Note (Signed)
 Most symptomatic. Recently evaluated by rheumatologist and according to patient, she was instructed to continue following with PCP. Gabapentin  helped in the past, so resume at 100 mg 2 capsules daily at bedtime. Adequate sleep hygiene and low impact exercise. Follow-up in 5 months.

## 2024-04-30 NOTE — Assessment & Plan Note (Signed)
 She is on B12 1000 mcg q 4 weeks. B12 557 recently at her endocrinologist's offcie. She would like to schedule injections here.

## 2024-04-30 NOTE — Patient Instructions (Addendum)
 A few things to remember from today's visit:  Loud snoring - Plan: Ambulatory referral to Sleep Studies  Fibromyalgia - Plan: gabapentin  (NEURONTIN ) 100 MG capsule  Hyperlipidemia, unspecified hyperlipidemia type - Plan: rosuvastatin  (CRESTOR ) 10 MG tablet  Throat discomfort  Gastroesophageal reflux disease, unspecified whether esophagitis present - Plan: sucralfate  (CARAFATE ) 1 g tablet  Resume Gabapentin  200 mg at bedtime. Throat discomfort can be related to Gastro issues. Arrange mammogram.  If you need refills for medications you take chronically, please call your pharmacy. Do not use My Chart to request refills or for acute issues that need immediate attention. If you send a my chart message, it may take a few days to be addressed, specially if I am not in the office.  Please be sure medication list is accurate. If a new problem present, please set up appointment sooner than planned today.

## 2024-04-30 NOTE — Progress Notes (Signed)
 HPI: Chief Complaint  Patient presents with   Medical Management of Chronic Issues   [name] is a 58 y.o. female with a PMHx significant for GERD, HTN, HLD, latent autoimmune DM, hypothyroidism, insomnia, and fibromyalgia, who is here today for chronic disease management. Since her last visit she has established with a new endocrinologist and saw rheumatologist.  Last seen 03/27/2024   Throat tightness // GERD : Pt complains of esophageal tightness; feels this sensation "on the inside" of her throat.  Her sputum has felt thicker than normal, ongoing for 1-2 weeks.  She has had similar symptoms before, prior to her GI issues beginning.  Overall her condition has improved but not fully resolved.  Denies any cough, wheezing, stridor, oral pruritus or edema. Mild rhinorrhea in the morning and nasal congestion.  Pt has been taking left over Sucralfate  and was taking 2-3 times per day before meals stating this helped manage her GI symptoms including upper abdominal pain.  Of note, her GI consult was rescheduled to 05/29/24.  Fibromyalgia: She has been seen by rheumatology for an initial consult on 4/11.  She continues to experience chronic pain (myalgias and arthralgias) and would like to restart gabapentin  to manage this. She took medication before, discontinued because pain was improved. She does reports regularly walking as recommended  by Dr Ang (rheumatologist).  According to pt, it was also recommended to have a sleep study.  Pt continues to experience difficulty sleeping and endorses poor sleep quality.  Pt reports loud snoring, stating her family members are unable to sleep around her.  She reports having  a sleep study years ago and it was "borderline" for sleep apnea. She also has a fmhx of sleep apnea (sisters).  Insomnia on Ambien  CR 12.5 mg daily.  B12 deficiency/pernicious anemia: Currently she is on B12 at 1000 mcg IM every 4 weeks, she is asking if she can  continue coming to the office to get her monthly injection. B12 557 on 04/16/2024.  Hyperlipidemia: Currently she is on rosuvastatin  10 mg daily, she did not have the prescription at her pharmacy, so she is taking 2 tablets of 5 mg. Lipid panel was checked at her endocrinologist office on 04/16/2024. She has tolerated medication well.  Component Ref Range & Units 2 wk ago  Cholesterol, Total, Lipid Panel <200 mg/dL 161    Triglycerides, Lipid Panel <150 mg/dL 096    HDL Cholesterol - Lipid Panel >=60 mg/dL 48 Low      LDL Cholesterol, Calculated <100 mg/dL 71    Non-HDL Cholesterol mg/dL 91   Review of Systems  Constitutional:  Positive for fatigue. Negative for activity change and appetite change.  HENT:  Negative for facial swelling, mouth sores and trouble swallowing.   Respiratory:  Negative for cough, shortness of breath and wheezing.   Gastrointestinal:  Negative for nausea and vomiting.  Genitourinary:  Negative for decreased urine volume, dysuria and hematuria.  Musculoskeletal:  Positive for arthralgias and myalgias.  Neurological:  Negative for syncope and facial asymmetry.  See other pertinent positives and negatives in HPI.  Current Outpatient Medications on File Prior to Visit  Medication Sig Dispense Refill   albuterol  (VENTOLIN  HFA) 108 (90 Base) MCG/ACT inhaler Inhale 2 puffs into the lungs every 6 (six) hours as needed for wheezing or shortness of breath. 8 g 0   Calcium  Carb-Cholecalciferol (CALCIUM  1000 + D) 1000-800 MG-UNIT TABS Take 1 tablet by mouth as needed.     cholecalciferol (VITAMIN D3)  25 MCG (1000 UNIT) tablet Take 1 tablet by mouth. Three times a week as needed     cyanocobalamin  (VITAMIN B12) 1000 MCG/ML injection Inject 1 mL (1,000 mcg total) into the muscle every 30 (thirty) days. 3 mL 2   estradiol (ESTRACE) 1 MG tablet Take 0.5 mg by mouth daily.     insulin aspart (FIASP FLEXTOUCH) 100 UNIT/ML FlexTouch Pen Inject 4 U in the morning, 5 U at  lunch, and 4 U in the evening.     insulin degludec (TRESIBA) 100 UNIT/ML FlexTouch Pen Inject 5 Units into the skin at bedtime.     levothyroxine  (SYNTHROID ) 88 MCG tablet 1/2 tab Tuesdays and Thursdays. 1 tab the rest of days 180 tablet 0   losartan  (COZAAR ) 50 MG tablet Take 1 tablet (50 mg total) by mouth daily. 90 tablet 2   Teriparatide, Recombinant, (FORTEO) 600 MCG/2.4ML SOPN Inject into the skin daily.     Tofacitinib Citrate (XELJANZ) 5 MG TABS Take 1 tablet by mouth daily at 12 noon.     zolpidem  (AMBIEN  CR) 12.5 MG CR tablet Take 1 tablet (12.5 mg total) by mouth at bedtime as needed. for sleep 90 tablet 0   No current facility-administered medications on file prior to visit.    Past Medical History:  Diagnosis Date   Fibromyalgia    GERD (gastroesophageal reflux disease)    HLD (hyperlipidemia)    Hypoglycemia    Hypothyroidism    Insomnia    Prediabetes    Allergies  Allergen Reactions   Lexapro [Escitalopram] Itching   Other     Patient does not want any narcotics- makes her itch.   Soma [Carisoprodol] Itching   Tramadol Itching    Ultram    Social History   Socioeconomic History   Marital status: Single    Spouse name: Not on file   Number of children: 0   Years of education: Not on file   Highest education level: Not on file  Occupational History   Occupation: retired  Tobacco Use   Smoking status: Never   Smokeless tobacco: Never  Vaping Use   Vaping status: Never Used  Substance and Sexual Activity   Alcohol use: Not Currently   Drug use: Never   Sexual activity: Not on file  Other Topics Concern   Not on file  Social History Narrative   Not on file   Social Drivers of Health   Financial Resource Strain: Low Risk  (08/07/2021)   Received from [facility], [facility]   Overall Financial Resource Strain (CARDIA)    Difficulty of Paying Living Expenses: Not hard at all  Food Insecurity: Low Risk  (03/30/2024)   Received from [facility]   Hunger Vital Sign    Worried About Running Out of Food in the Last Year: Never true    Ran Out of Food in the Last Year: Never true  Transportation Needs: No Transportation Needs (03/30/2024)   Received from [facility]    In the past 12 months, has lack of reliable transportation kept you from medical appointments, meetings, work or from getting things needed for daily living? : No  Physical Activity: Inactive (08/07/2021)   Received from [facility], [facility]   Exercise Vital Sign    Days of Exercise per Week: 0 days    Minutes of Exercise per Session: 0 min  Stress: No Stress Concern Present (08/07/2021)   Received from [facility], [facility]  [employer] of Occupational Health - Occupational Stress Questionnaire    Feeling of Stress : Not at all  Social Connections: Unknown (04/26/2022)   Received from [facility], [facility]   Social Network    Social Network: Not on file    Vitals:   04/30/24 1408  BP: 118/70  Pulse: (!) 105  Resp: 12  SpO2: 98%   Body mass index is 19.79 kg/m.  Physical Exam Vitals and nursing note reviewed.  Constitutional:      General: She is not in acute distress.    Appearance: She is well-developed.  HENT:     Head: Normocephalic and atraumatic.     Mouth/Throat:     Mouth: Mucous membranes are moist.     Pharynx: Oropharynx is clear. Uvula midline. No pharyngeal swelling, oropharyngeal exudate or posterior oropharyngeal erythema.     Comments: Limited mouth opening, no pain,but could not see the tip of uvula. Eyes:     Conjunctiva/sclera: Conjunctivae normal.  Neck:     Thyroid : No thyroid  mass.  Cardiovascular:     Rate and Rhythm: Normal rate and regular rhythm.     Heart sounds: No murmur heard.    Comments: HR 92/min Pulmonary:     Effort: Pulmonary effort is normal. No respiratory distress.     Breath sounds: Normal breath sounds.  Abdominal:     Palpations: Abdomen is soft.  There is no mass.     Tenderness: There is no abdominal tenderness.  Musculoskeletal:     Cervical back: No edema or erythema. No pain with movement.     Right lower leg: No edema.     Left lower leg: No edema.     Comments: No signs of synovitis.  Skin:    General: Skin is warm.     Findings: No erythema or rash.  Neurological:     General: No focal deficit present.     Mental Status: She is alert and oriented to person, place, and time.     Cranial Nerves: No cranial nerve deficit.     Gait: Gait normal.  Psychiatric:        Mood and Affect: Affect normal. Mood is anxious.   ASSESSMENT AND PLAN: Ms. [name] was seen today for chronic disease management.  Orders Placed This Encounter  Procedures   Ambulatory referral to Sleep Studies   Loud snoring Reporting "borderline" sleep study years ago.  -     Ambulatory referral to Sleep Studies  Fibromyalgia Assessment & Plan: Most symptomatic. Recently evaluated by rheumatologist and according to patient, she was instructed to continue following with PCP. Gabapentin  helped in the past, so resume at 100 mg 2 capsules daily at bedtime. Adequate sleep hygiene and low impact exercise. Follow-up in 5 months.  Orders: -     Gabapentin ; Take 2 capsules (200 mg total) by mouth at bedtime.  Dispense: 60 capsule; Refill: 2  Hyperlipidemia, unspecified hyperlipidemia type Assessment & Plan: LDL improved, it went from 94 to 71. Continue rosuvastatin  10 mg daily and low-fat diet.  Orders: -     Rosuvastatin  Calcium ; Take 1 tablet (10 mg total) by mouth daily.  Dispense: 90 tablet; Refill: 3  Throat discomfort Today I did not appreciate any abnormality or edema of visualized oropharyngeal area. We discussed possible etiologies. Problem could be related with GERD. If persistent we could consider ENT evaluation. Instructed about warning signs.  Gastroesophageal reflux disease, unspecified whether esophagitis  present Assessment & Plan: Reports  improvement with sucralfate  1 g an hour before meals, she will continue. Continue GERD precautions. She has appointment with new GI provider on 05/29/2024.  Orders: -     Sucralfate ; Take 1 tablet (1 g total) by mouth 3 (three) times daily before meals. An hour before meals.  Dispense: 90 tablet; Refill: 0  Pernicious anemia Assessment & Plan: She is on B12 1000 mcg q 4 weeks. B12 557 recently at her endocrinologist's offcie. She would like to schedule injections here.  She will schedule a nurse visit for Prevnar 20 and Shingrix after returning from [place] . Overdue for mammogram, recommend arranging appt.  I spent a total of 44 minutes in both face to face and non face to face activities for this visit on the date of this encounter. During this time history was obtained and documented, examination was performed, prior labs/imaging reviewed, and assessment/plan discussed.  Return in 5 months (on 09/30/2024) for chronic problems.  I, Bernita Bristle, acting as a scribe for Tanner Vigna [place], MD., have documented all relevant documentation on the behalf of Zarek Relph [place], MD, as directed by   while in the presence of Yariana Hoaglund [place], MD.  I, Cristiano Capri [place], MD, have reviewed all documentation for this visit. The documentation on 04/30/24 for the exam, diagnosis, procedures, and orders are all accurate and complete.  Quantay Zaremba G. [place], MD  [facility] [place] office.

## 2024-04-30 NOTE — Assessment & Plan Note (Signed)
 Reports improvement with sucralfate  1 g an hour before meals, she will continue. Continue GERD precautions. She has appointment with new GI provider on 05/29/2024.

## 2024-05-04 ENCOUNTER — Encounter: Payer: Self-pay | Admitting: Family Medicine

## 2024-05-29 DIAGNOSIS — L439 Lichen planus, unspecified: Secondary | ICD-10-CM | POA: Diagnosis not present

## 2024-05-29 DIAGNOSIS — K638219 Small intestinal bacterial overgrowth, unspecified: Secondary | ICD-10-CM | POA: Diagnosis not present

## 2024-05-31 ENCOUNTER — Ambulatory Visit (INDEPENDENT_AMBULATORY_CARE_PROVIDER_SITE_OTHER): Admitting: Neurology

## 2024-05-31 ENCOUNTER — Encounter: Payer: Self-pay | Admitting: Neurology

## 2024-05-31 VITALS — BP 100/58 | HR 74 | Ht <= 58 in | Wt 95.2 lb

## 2024-05-31 DIAGNOSIS — F5104 Psychophysiologic insomnia: Secondary | ICD-10-CM

## 2024-05-31 DIAGNOSIS — R0683 Snoring: Secondary | ICD-10-CM | POA: Diagnosis not present

## 2024-05-31 DIAGNOSIS — M2619 Other specified anomalies of jaw-cranial base relationship: Secondary | ICD-10-CM

## 2024-05-31 DIAGNOSIS — M2604 Mandibular hypoplasia: Secondary | ICD-10-CM | POA: Diagnosis not present
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
## 2024-06-11 ENCOUNTER — Ambulatory Visit (INDEPENDENT_AMBULATORY_CARE_PROVIDER_SITE_OTHER): Admitting: Family Medicine

## 2024-06-11 ENCOUNTER — Encounter: Payer: Self-pay | Admitting: Family Medicine

## 2024-06-11 VITALS — BP 110/70 | HR 100 | Resp 16 | Ht <= 58 in | Wt 94.5 lb

## 2024-06-11 DIAGNOSIS — L299 Pruritus, unspecified: Secondary | ICD-10-CM | POA: Diagnosis not present

## 2024-06-11 DIAGNOSIS — E538 Deficiency of other specified B group vitamins: Secondary | ICD-10-CM

## 2024-06-11 DIAGNOSIS — R070 Pain in throat: Secondary | ICD-10-CM | POA: Diagnosis not present

## 2024-06-11 DIAGNOSIS — M542 Cervicalgia: Secondary | ICD-10-CM | POA: Diagnosis not present

## 2024-06-11 LAB — HEPATIC FUNCTION PANEL
ALT: 14 U/L (ref 0–35)
AST: 15 U/L (ref 0–37)
Albumin: 4.2 g/dL (ref 3.5–5.2)
Alkaline Phosphatase: 51 U/L (ref 39–117)
Bilirubin, Direct: 0.2 mg/dL (ref 0.0–0.3)
Total Bilirubin: 1 mg/dL (ref 0.2–1.2)
Total Protein: 7.1 g/dL (ref 6.0–8.3)

## 2024-06-11 LAB — BASIC METABOLIC PANEL WITH GFR
BUN: 11 mg/dL (ref 6–23)
CO2: 27 meq/L (ref 19–32)
Calcium: 9.7 mg/dL (ref 8.4–10.5)
Chloride: 102 meq/L (ref 96–112)
Creatinine, Ser: 0.6 mg/dL (ref 0.40–1.20)
GFR: 99.63 mL/min (ref >60.00)
Glucose, Bld: 89 mg/dL (ref 70–99)
Potassium: 4.1 meq/L (ref 3.5–5.1)
Sodium: 137 meq/L (ref 135–145)

## 2024-06-11 LAB — CBC
HCT: 38.4 % (ref 36.0–46.0)
Hemoglobin: 12.6 g/dL (ref 12.0–15.0)
MCHC: 32.9 g/dL (ref 30.0–36.0)
MCV: 88.9 fl (ref 78.0–100.0)
Platelets: 282 10*3/uL (ref 150.0–400.0)
RBC: 4.31 Mil/uL (ref 3.87–5.11)
RDW: 13 % (ref 11.5–15.5)
WBC: 6.9 10*3/uL (ref 4.0–10.5)

## 2024-06-11 LAB — TSH: TSH: 2.6 u[IU]/mL (ref 0.35–5.50)

## 2024-06-11 LAB — C-REACTIVE PROTEIN: CRP: <1 mg/dL (ref 0.5–20.0)

## 2024-06-11 MED ORDER — TRIAMCINOLONE 0.1 % CREAM:EUCERIN CREAM 1:1: 1.0000 | TOPICAL_CREAM | Freq: Two times a day (BID) | CUTANEOUS | 0 refills | Status: DC | PRN

## 2024-06-11 MED ORDER — TRIAMCINOLONE ACETONIDE 0.1 % EX CREA: 1.0000 | TOPICAL_CREAM | Freq: Two times a day (BID) | CUTANEOUS | 0 refills | Status: AC | PRN

## 2024-06-11 MED ORDER — CYANOCOBALAMIN 1000 MCG/ML IJ SOLN
1000.0000 ug | Freq: Once | INTRAMUSCULAR | Status: AC
  Administered 2024-06-11: 1000 ug via INTRAMUSCULAR

## 2024-06-11 NOTE — Progress Notes (Signed)
 ACUTE VISIT Chief Complaint  Patient presents with   Pruritis    All over, x 2 weeks. Benadryl helps at night & taking Zyrtec during the day.    leg cramps    Still having    throat discomfort    Still present    HPI: [name] is a 58 y.o. female, who is here today complaining of new onset of generalized pruritus, ongoing 2 weeks.  She notes that a rash only appears after scratching for some time.   Has been taking Zyrtec during the day and Benadryl at night, which has helped. Problem is contact, she has not identified exacerbating or alleviating factors. Stable since onset.  She also makes sure to moisturize and denies any skin dryness.  She denies any new medication, detergent, soap, or body product. No known insect bite or outdoor exposures to plants. No sick contact.   No jaundice, abdominal pain,or nausea, vomiting,or changes in bowel habits.. Denies any  numbness, tingling, or  burning sensation.      Component Value Date/Time   PROT 7.1 06/29/2022 0855   ALBUMIN 4.3 06/29/2022 0855   AST 12 (A) 05/20/2023 0000   ALT 14 05/20/2023 0000   ALKPHOS 61 06/29/2022 0855   BILITOT 1.4 (H) 06/29/2022 0855   BILIDIR 0.50 (A) 05/20/2023 0000    She also reports still experiencing throat discomfort inside the skin.  States food feeling stuck in her upper throat.  Her throat feels tighter when eating certain foods.  Pulling like sensation. Reports improvement when lying down.  She has established with GI for GERD like symptoms. Mentions that SIBO test was recommended and also suggested ENT evaluation.  B12 deficiency: She is on B12 1000 mcg IM. Pernicious anemia. Lab Results  Component Value Date   VITAMINB12 732 05/20/2023   Review of Systems  Constitutional:  Negative for activity change and appetite change.  Respiratory:  Negative for cough and shortness of breath.   Cardiovascular:  Negative for chest pain and leg swelling.  Endocrine: Negative  for cold intolerance and heat intolerance.  Genitourinary:  Negative for decreased urine volume, dysuria and hematuria.  Skin:  Negative for rash.  Neurological:  Negative for syncope, weakness and headaches.  See other pertinent positives and negatives in HPI.  Current Outpatient Medications on File Prior to Visit  Medication Sig Dispense Refill   albuterol  (VENTOLIN  HFA) 108 (90 Base) MCG/ACT inhaler Inhale 2 puffs into the lungs every 6 (six) hours as needed for wheezing or shortness of breath. 8 g 0   Calcium  Carb-Cholecalciferol (CALCIUM  1000 + D) 1000-800 MG-UNIT TABS Take 1 tablet by mouth as needed.     cholecalciferol (VITAMIN D3) 25 MCG (1000 UNIT) tablet Take 1 tablet by mouth. Three times a week as needed (Patient taking differently: Take 1 tablet by mouth as needed. Three times a week as needed)     cyanocobalamin  (VITAMIN B12) 1000 MCG/ML injection Inject 1 mL (1,000 mcg total) into the muscle every 30 (thirty) days. 3 mL 2   estradiol (ESTRACE) 1 MG tablet Take 0.5 mg by mouth daily.     gabapentin  (NEURONTIN ) 100 MG capsule Take 2 capsules (200 mg total) by mouth at bedtime. 60 capsule 2   insulin aspart (FIASP FLEXTOUCH) 100 UNIT/ML FlexTouch Pen Inject 4 U in the morning, 5 U at lunch, and 4 U in the evening.     insulin degludec (TRESIBA) 100 UNIT/ML FlexTouch Pen Inject 5 Units into the skin at  bedtime.     levothyroxine  (SYNTHROID ) 88 MCG tablet 1/2 tab Tuesdays and Thursdays. 1 tab the rest of days 180 tablet 0   losartan  (COZAAR ) 50 MG tablet Take 1 tablet (50 mg total) by mouth daily. 90 tablet 2   rosuvastatin  (CRESTOR ) 10 MG tablet Take 1 tablet (10 mg total) by mouth daily. 90 tablet 3   sucralfate  (CARAFATE ) 1 g tablet Take 1 tablet (1 g total) by mouth 3 (three) times daily before meals. An hour before meals. 90 tablet 0   Teriparatide, Recombinant, (FORTEO) 600 MCG/2.4ML SOPN Inject into the skin daily.     Tofacitinib Citrate (XELJANZ) 5 MG TABS Take 1 tablet by  mouth daily at 12 noon.     zolpidem  (AMBIEN  CR) 12.5 MG CR tablet Take 1 tablet (12.5 mg total) by mouth at bedtime as needed. for sleep 90 tablet 0   No current facility-administered medications on file prior to visit.   Past Medical History:  Diagnosis Date   Fibromyalgia    GERD (gastroesophageal reflux disease)    HLD (hyperlipidemia)    Hypoglycemia    Hypothyroidism    Insomnia    Prediabetes    Allergies  Allergen Reactions   Lexapro [Escitalopram] Itching   Omeprazole Magnesium     Other Reaction(s): choking, burning throat   Other     Patient does not want any narcotics- makes her itch.   Soma [Carisoprodol] Itching   Tramadol Itching    Ultram    Social History   Socioeconomic History   Marital status: Single    Spouse name: Not on file   Number of children: 0   Years of education: Not on file   Highest education level: Not on file  Occupational History   Occupation: retired  Tobacco Use   Smoking status: Never   Smokeless tobacco: Never  Vaping Use   Vaping status: Never Used  Substance and Sexual Activity   Alcohol use: Not Currently   Drug use: Never   Sexual activity: Not Currently  Other Topics Concern   Not on file  Social History Narrative   Not on file   Social Drivers of Health   Financial Resource Strain: Low Risk  (08/07/2021)   Received from [facility]   Overall Financial Resource Strain (CARDIA)    Difficulty of Paying Living Expenses: Not hard at all  Food Insecurity: Low Risk  (03/30/2024)   Received from [facility]   Hunger Vital Sign    Within the past 12 months, you worried that your food would run out before you got money to buy more: Never true    Within the past 12 months, the food you bought just didn't last and you didn't have money to get more. : Never true  Transportation Needs: No Transportation Needs (03/30/2024)   Received from [facility]    In the past 12 months, has lack of reliable  transportation kept you from medical appointments, meetings, work or from getting things needed for daily living? : No  Physical Activity: Inactive (08/07/2021)   Received from [facility]   Exercise Vital Sign    On average, how many days per week do you engage in moderate to strenuous exercise (like a brisk walk)?: 0 days    On average, how many minutes do you engage in exercise at this level?: 0 min  Stress: No Stress Concern Present (08/07/2021)   Received from [facility]  [facility]upational [facility] - Occupational Stress Questionnaire    Feeling of Stress : Not at all  Social Connections: Unknown (04/26/2022)   [facility] from [facility]   Social Network    Social Network: Not on file    Vitals:   06/11/24 1134  BP: 110/70  Pulse: 100  Resp: 16  SpO2: 97%   Body mass index is 19.75 kg/m.  Physical Exam Vitals and nursing note reviewed.  Constitutional:      General: She is not in acute distress.    Appearance: She is well-developed.  HENT:     Head: Normocephalic and atraumatic.     Mouth/Throat:     Mouth: Mucous membranes are moist.     Pharynx: Oropharynx is clear.   Eyes:     Conjunctiva/sclera: Conjunctivae normal.   Neck:     Thyroid : No thyroid  mass.   Cardiovascular:     Rate and Rhythm: Normal rate and regular rhythm.     Heart sounds: No murmur heard. Pulmonary:     Effort: Pulmonary effort is normal. No respiratory distress.     Breath sounds: Normal breath sounds.  Abdominal:     Palpations: Abdomen is soft. There is no mass.     Tenderness: There is no abdominal tenderness.   Musculoskeletal:     Cervical back: No edema or erythema. No pain with movement.     Right lower leg: No edema.     Left lower leg: No edema.  Lymphadenopathy:     Cervical: No cervical adenopathy.   Skin:    General: Skin is warm.     Findings: No erythema or rash.     Comments: Scratching skin frequently during visit.   Neurological:      General: No focal deficit present.     Mental Status: She is alert and oriented to person, place, and time.     Cranial Nerves: No cranial nerve deficit.     Gait: Gait normal.   Psychiatric:        Mood and Affect: Affect normal. Mood is anxious.   ASSESSMENT AND PLAN: Ms. [name] was seen today for pruritus.  Lab Results  Component Value Date   WBC 6.9 06/11/2024   HGB 12.6 06/11/2024   HCT 38.4 06/11/2024   MCV 88.9 06/11/2024   PLT 282.0 06/11/2024   Lab Results  Component Value Date   TSH 2.60 06/11/2024   Lab Results  Component Value Date   CRP <1.0 06/11/2024   Lab Results  Component Value Date   ALT 14 06/11/2024   AST 15 06/11/2024   ALKPHOS 51 06/11/2024   BILITOT 1.0 06/11/2024   Lab Results  Component Value Date   NA 137 06/11/2024   CL 102 06/11/2024   K 4.1 06/11/2024   CO2 27 06/11/2024   BUN 11 06/11/2024   CREATININE 0.60 06/11/2024   GFR 99.63 06/11/2024   CALCIUM  9.7 06/11/2024   ALBUMIN 4.2 06/11/2024   GLUCOSE 89 06/11/2024   Generalized pruritus No associated rash. We discussed possible etiologies. She agrees with trying combination Eucerin:triamcinolone  1:1, which she thinks she can mix herself, bid prn. Monitor for new symptoms. Further recommendations according to lab results. May need dermatology consultation if problem does not resolve.  -     CBC; Future -     TSH; Future -     C-reactive protein; Future -     Hepatic function panel; Future -     Basic metabolic  panel with GFR; Future -     Triamcinolone  Acetonide; Apply 1 Application topically 2 (two) times daily as needed.  Dispense: 80 g; Refill: 0  Throat discomfort -     Ambulatory referral to ENT  Anterior neck pain Discomfort on anterior upper aspect. On examination I do not appreciate neck masses or other abnormalities to explain discomfort, it could be related to scarring changes. She would like to have a US  done and to have blood work, including TSH.  -      US  SOFT TISSUE HEAD & NECK (NON-THYROID ); Future  Vitamin B12 deficiency Problem is well controlled. Continue B12 1000 mcg IM q 4-5 weeks. Today after verbal consent she received B12 1000 mcg IM x 1.  -     Cyanocobalamin   Return if symptoms worsen or fail to improve, for keep next appointment.  I, Vernell Forest, acting as a scribe for Andrik Sandt [place], [name]., have documented all relevant documentation on the behalf of Marisa Lerner [place], [name], as directed by   while in the presence of Dean Goldner [place], [name].  I, Paden Senger [place], [name], have reviewed all documentation for this visit. The documentation on 06/11/24 for the exam, diagnosis, procedures, and orders are all accurate and complete.  Karole Oo G. [place], [name]  [facility]. [place] office.

## 2024-06-11 NOTE — Patient Instructions (Addendum)
 A few things to remember from today's visit:  Throat discomfort - Plan: Ambulatory referral to ENT  Anterior neck pain - Plan: US  Soft Tissue Head/Neck (NON-THYROID )  Vitamin B12 deficiency  Generalized pruritus - Plan: CBC, TSH, C-reactive protein, Hepatic function panel, Basic metabolic panel with GFR, Triamcinolone  Acetonide (TRIAMCINOLONE  0.1 % CREAM : EUCERIN) CREA  Not sure what is causing generalized itching. Try combination cream 2 times daily as needed. Monitor for new symptoms. ENT referral placed and ultrasound as requested.  If you need refills for medications you take chronically, please call your pharmacy. Do not use My Chart to request refills or for acute issues that need immediate attention. If you send a my chart message, it may take a few days to be addressed, specially if I am not in the office.  Please be sure medication list is accurate. If a new problem present, please set up appointment sooner than planned today.

## 2024-06-14 ENCOUNTER — Encounter: Payer: Self-pay | Admitting: Family Medicine

## 2024-06-15 ENCOUNTER — Other Ambulatory Visit: Payer: Self-pay

## 2024-06-15 LAB — HEMOGLOBIN A1C: Hemoglobin A1C: 6

## 2024-06-15 MED ORDER — METHYLPREDNISOLONE 4 MG PO TBPK: ORAL_TABLET | ORAL | 0 refills | Status: DC

## 2024-06-15 MED ORDER — AZITHROMYCIN 250 MG PO TABS: ORAL_TABLET | ORAL | 0 refills | Status: AC

## 2024-06-15 NOTE — Telephone Encounter (Signed)
 I understand. Please call in a Zpack and a Medrol dose pack for her

## 2024-06-15 NOTE — Telephone Encounter (Signed)
 I would be happy to prescribe her an antibiotic and a steroid, but her comment about left calf pain is worrisome. Can you get more information about this ? (How long,etc)

## 2024-06-15 NOTE — Telephone Encounter (Signed)
 I spoke with [name], calf pain has been ongoing since March, since she returned from [place], she has seen PCP for this.

## 2024-06-22 ENCOUNTER — Ambulatory Visit (HOSPITAL_COMMUNITY)
Admission: EM | Admit: 2024-06-22 | Discharge: 2024-06-22 | Disposition: A | Discharge status: Home / Self Care | Attending: Internal Medicine | Admitting: Internal Medicine

## 2024-06-22 ENCOUNTER — Ambulatory Visit (INDEPENDENT_AMBULATORY_CARE_PROVIDER_SITE_OTHER)

## 2024-06-22 ENCOUNTER — Encounter (HOSPITAL_COMMUNITY): Payer: Self-pay

## 2024-06-22 DIAGNOSIS — R0789 Other chest pain: Secondary | ICD-10-CM | POA: Diagnosis not present

## 2024-06-22 DIAGNOSIS — M47812 Spondylosis without myelopathy or radiculopathy, cervical region: Secondary | ICD-10-CM | POA: Diagnosis not present

## 2024-06-22 DIAGNOSIS — M542 Cervicalgia: Secondary | ICD-10-CM

## 2024-06-22 DIAGNOSIS — S29011A Strain of muscle and tendon of front wall of thorax, initial encounter: Secondary | ICD-10-CM | POA: Diagnosis not present

## 2024-06-22 DIAGNOSIS — L439 Lichen planus, unspecified: Secondary | ICD-10-CM | POA: Diagnosis not present

## 2024-06-22 DIAGNOSIS — J029 Acute pharyngitis, unspecified: Secondary | ICD-10-CM | POA: Diagnosis not present

## 2024-06-22 DIAGNOSIS — R0989 Other specified symptoms and signs involving the circulatory and respiratory systems: Secondary | ICD-10-CM

## 2024-06-22 DIAGNOSIS — I700 Atherosclerosis of aorta: Secondary | ICD-10-CM | POA: Diagnosis not present

## 2024-06-22 DIAGNOSIS — M4312 Spondylolisthesis, cervical region: Secondary | ICD-10-CM | POA: Diagnosis not present

## 2024-06-22 MED ORDER — METHYLPREDNISOLONE SODIUM SUCC 125 MG IJ SOLR: 80.0000 mg | Freq: Once | INTRAMUSCULAR | Status: DC

## 2024-06-22 MED ORDER — METHYLPREDNISOLONE SODIUM SUCC 125 MG IJ SOLR
INTRAMUSCULAR | Status: AC
  Filled 2024-06-22: qty 2

## 2024-06-22 MED ORDER — CLOBETASOL PROPIONATE 0.05 % EX CREA: 1.0000 | TOPICAL_CREAM | Freq: Two times a day (BID) | CUTANEOUS | 2 refills | Status: AC

## 2024-06-22 MED ORDER — HYDROXYZINE HCL 25 MG PO TABS: 25.0000 mg | ORAL_TABLET | Freq: Four times a day (QID) | ORAL | 0 refills | Status: DC | PRN

## 2024-06-22 MED ORDER — METHYLPREDNISOLONE SODIUM SUCC 125 MG IJ SOLR: 40.0000 mg | Freq: Once | INTRAMUSCULAR | Status: DC

## 2024-06-22 MED ORDER — PREDNISONE 20 MG PO TABS: 40.0000 mg | ORAL_TABLET | Freq: Every day | ORAL | 0 refills | Status: DC

## 2024-06-22 NOTE — Discharge Instructions (Addendum)
 X-ray done of the soft tissue of the neck and chest x-ray done today.  Final evaluation by the radiologist shows no acute processes.  Symptoms along the chest wall could be secondary to costochondritis versus other musculoskeletal type diagnosis but not likely pulmonary given the negative chest x-ray as well as the reproduction of symptoms with palpitation.  The throat symptoms have been going on for several months now.  The x-ray was negative for acute findings but highly recommend keeping follow-up appointment for ultrasound and with ENT.  For the rash and itching we can try hydroxyzine  by mouth as well as trying a higher potency topical steroid. Medrol  40 mg injection given today. This is a steroid to help with inflammation and pain. Hydroxyzine  25 mg every 6 hours as needed for itching.  Use caution as this medication can cause drowsiness. Start tomorrow: Prednisone  40 mg (2 tablets) once daily for 5 days. Take this in the morning.  This is a steroid to help with inflammation and pain. Make sure to monitor your blood sugars closely while you are on the steroids. Clobetasol  cream twice daily to the affected areas as needed for itching but do not apply to the neck or face. Keep appointment for soft tissue ultrasound of the neck Keep follow-up appointments with gastroenterology, ENT and primary care. May return to urgent care as needed.

## 2024-06-22 NOTE — ED Provider Notes (Signed)
 [facility]    CSN: [id number] Arrival date & time: 06/22/24  1526      History   Chief Complaint Chief Complaint  Patient presents with   Nasal Congestion   Rash    HPI [name] is a 58 y.o. female.   58 year old female who presents to urgent care with several complaints.  Rash: she has had significant pruritus for several months. She has seen her PCP for this and was given triamcinolone  and she has been taking benadryl and zyrtec. This has not helped much at all. The itching is much worse at night. She has an appointment with dermatology but not until January and this appointment is also for her Lichen planus. She feels that this got worse after she ran out of her earma that she was prescribed while she is [place]. She was also prescribed a medrol  dose pack but she had concerns as she is on day 3 and her blood sugars have spiked and she has had to double up on her short acting insulin.   Sore Throat: This has been going on for 3-4 months. She feels that it is worsening and feels like it extends into her chest. She is not having shortness of breath. She has seen her PCP about this and gastroenterology. She has a diagnosis of oropharyngeal lichen planus.  When she saw gastroenterology they are recommending biopsies of the esophagus to see if it has extended here. She feels like she has persistent swelling in her throat. She has been scheduled for a soft tissue ultrasound by her primary care physician for this as well.  She saw them just recently and had multiple blood work test done that were all within normal limits  Chest wall tenderness: Patient reports several months of having tenderness around her chest wall.  She even has trouble wearing her bra due to this.  She reports the tenderness is worse with palpitation.  She has had this in the past and was given 10 days worth of prednisone  at that time which did seem to take care of the symptoms.  She does recall being told it  might be costochondritis when I mention this to her.        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   Retrognathia 05/31/2024   Chronic insomnia 05/31/2024   Snoring 05/31/2024   Fibromyalgia 04/30/2024   Vitamin B12 deficiency 04/16/2024   Type 1 diabetes mellitus without complication (HCC) 04/16/2024   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   Vitamin D  deficiency 04/16/2024   Lichen planus 03/27/2024   Polyarthralgia 09/26/2023   Myalgia 07/15/2023   Numbness and tingling 11/25/2022   Right elbow pain 11/25/2022   Essential (primary) hypertension 07/19/2022   LADA (latent autoimmune diabetes in adults), 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   Localized osteoporosis without current pathological fracture 07/02/2022   Pernicious anemia 07/02/2022   Vitamin D  deficiency, unspecified 07/02/2022   Gastroesophageal reflux disease 06/21/2022   Hypothyroidism due to Hashimoto's thyroiditis 06/21/2022   Insomnia 06/21/2022   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      Home Medications    Prior to Admission medications   Medication Sig Start Date End Date Taking? Authorizing Provider  clobetasol  cream (TEMOVATE ) 0.05 % Apply 1 Application topically 2 (two) times daily. 06/22/24  Yes Pegah Segel A, PA-C   hydrOXYzine  (ATARAX ) 25 MG tablet Take 1 tablet (25 mg total) by mouth every 6 (six) hours as needed. 06/22/24  Yes Machi Whittaker A, PA-C  predniSONE  (DELTASONE ) 20 MG tablet Take 2 tablets (40 mg total) by mouth daily with breakfast for 5 days. 06/22/24 06/27/24 Yes Nocole Zammit A, PA-C  Calcium  Carb-Cholecalciferol (CALCIUM  1000 + D) 1000-800 MG-UNIT TABS Take 1 tablet by mouth as needed.    [provider]  cholecalciferol (VITAMIN D3) 25 MCG (1000 UNIT) tablet Take 1 tablet by mouth. Three times a week as needed Patient taking differently: Take 1 tablet by mouth as needed. Three times a week as needed    [provider]  cyanocobalamin  (VITAMIN B12) 1000 MCG/ML injection Inject 1 mL (1,000 mcg total) into the muscle every 30 (thirty) days. 07/15/23   [place], Betty G, MD  gabapentin  (NEURONTIN ) 100 MG capsule Take 2 capsules (200 mg total) by mouth at bedtime. 04/30/24   [place], Betty G, MD  insulin aspart (FIASP FLEXTOUCH) 100 UNIT/ML FlexTouch Pen Inject 4 U in the morning, 5 U at lunch, and 4 U in the evening.    [provider]  insulin degludec (TRESIBA) 100 UNIT/ML FlexTouch Pen Inject 5 Units into the skin at bedtime.    [provider]  levothyroxine  (SYNTHROID ) 88 MCG tablet 1/2 tab Tuesdays and Thursdays. 1 tab the rest of days 11/23/22   [place], Betty G, MD  losartan  (COZAAR ) 50 MG tablet Take 1 tablet (50 mg total) by mouth daily. 03/27/24   [place], Betty G, MD  rosuvastatin  (CRESTOR ) 10 MG tablet Take 1 tablet (10 mg total) by mouth daily. 04/30/24   [place], Betty G, MD  sucralfate  (CARAFATE ) 1 g tablet Take 1 tablet (1 g total) by mouth 3 (three) times daily before meals. An hour before meals. 04/30/24   [place], Betty G, MD  Teriparatide, Recombinant, (FORTEO) 600 MCG/2.4ML SOPN Inject into the skin daily.    [provider]  Tofacitinib Citrate (XELJANZ) 5 MG TABS Take 1 tablet by mouth daily at 12 noon. Patient not taking: Reported on  06/22/2024    [provider]  triamcinolone  cream (KENALOG ) 0.1 % Apply 1 Application topically 2 (two) times daily as needed. 06/11/24   [place], Betty G, MD  zolpidem  (AMBIEN  CR) 12.5 MG CR tablet Take 1 tablet (12.5 mg total) by mouth at bedtime as needed. for sleep 03/27/24   [place], Betty G, MD    Family History Family History  Problem Relation Age of Onset   Diabetes Mother    Hypertension Mother    Heart failure Mother    Irritable bowel syndrome Mother    Hypothyroidism Mother    Atrial fibrillation Mother    Hyperlipidemia Mother    Hypertension Father    Heart disease Father    Hyperlipidemia Father  Diabetes Maternal Grandmother    Hypertension Maternal Grandmother    Hypertension Maternal Grandfather    Hypertension Paternal Grandmother    Hypertension Paternal Grandfather     Social History Social History   Tobacco Use   Smoking status: Never   Smokeless tobacco: Never  Vaping Use   Vaping status: Never Used  Substance Use Topics   Alcohol use: Not Currently   Drug use: Never     Allergies   Lexapro [escitalopram], Omeprazole magnesium, Other, Soma [carisoprodol], and Tramadol   Review of Systems Review of Systems  Constitutional:  Negative for appetite change, chills and fever.  HENT:  Positive for postnasal drip and sore throat. Negative for ear pain.   Eyes:  Negative for pain and visual disturbance.  Respiratory:  Positive for chest tightness. Negative for cough and shortness of breath.        Chest congestion  Cardiovascular:  Negative for chest pain and palpitations.  Gastrointestinal:  Negative for abdominal pain and vomiting.  Genitourinary:  Negative for dysuria and hematuria.  Musculoskeletal:  Negative for arthralgias and back pain.       Tenderness around the chest wall with palpitation   Skin:  Positive for rash. Negative for color change.       Itching especially at night  Neurological:  Negative for seizures and syncope.  All  other systems reviewed and are negative.    Physical Exam Triage Vital Signs ED Triage Vitals  Encounter Vitals Group     BP 06/22/24 1544 (!) 144/92     Girls Systolic BP Percentile --      Girls Diastolic BP Percentile --      Boys Systolic BP Percentile --      Boys Diastolic BP Percentile --      Pulse Rate 06/22/24 1544 87     Resp 06/22/24 1544 16     Temp 06/22/24 1544 98 F (36.7 C)     Temp Source 06/22/24 1544 Oral     SpO2 06/22/24 1544 97 %     Weight --      Height --      Head Circumference --      Peak Flow --      Pain Score 06/22/24 1539 5     Pain Loc --      Pain Education --      Exclude from Growth Chart --    No data found.  Updated Vital Signs BP (!) 144/92 (BP Location: Left Arm)   Pulse 87   Temp 98 F (36.7 C) (Oral)   Resp 16   SpO2 97%   Visual Acuity Right Eye Distance:   Left Eye Distance:   Bilateral Distance:    Right Eye Near:   Left Eye Near:    Bilateral Near:     Physical Exam Vitals and nursing note reviewed.  Constitutional:      General: She is not in acute distress.    Appearance: Normal appearance. She is well-developed.  HENT:     Head: Normocephalic and atraumatic.     Right Ear: Tympanic membrane normal.     Left Ear: Tympanic membrane normal.     Nose: Nose normal.     Mouth/Throat:     Mouth: Mucous membranes are moist.  Eyes:     Conjunctiva/sclera: Conjunctivae normal.  Neck:     Thyroid : No thyroid  mass.     Trachea: Trachea normal.  Cardiovascular:     Rate and  Rhythm: Normal rate and regular rhythm.     Heart sounds: No murmur heard. Pulmonary:     Effort: Pulmonary effort is normal. No respiratory distress.     Breath sounds: Normal breath sounds.  Chest:     Chest wall: Tenderness present. No mass or edema.  Abdominal:     Palpations: Abdomen is soft.     Tenderness: There is no abdominal tenderness.  Musculoskeletal:        General: No swelling.     Cervical back: Neck supple. No edema  or erythema. No pain with movement. Normal range of motion.  Lymphadenopathy:     Cervical: No cervical adenopathy.  Skin:    General: Skin is warm and dry.     Capillary Refill: Capillary refill takes less than 2 seconds.  Neurological:     General: No focal deficit present.     Mental Status: She is alert.  Psychiatric:        Mood and Affect: Mood normal.      UC Treatments / Results  Labs (all labs ordered are listed, but only abnormal results are displayed) Labs Reviewed - No data to display  EKG   Radiology DG Neck Soft Tissue Result Date: 06/22/2024 CLINICAL DATA:  Sore throat neck pain EXAM: NECK SOFT TISSUES - 1+ VIEW COMPARISON:  None Available. FINDINGS: Left upper rib deformities. Normal prevertebral soft tissue thickness. Thin epiglottis. Patent trachea. Reversal of cervical lordosis. Grade 1 anterolisthesis C4 on C5. Mild to moderate degenerative changes C4 through C7. IMPRESSION: 1. Negative for acute abnormality. 2. Degenerative changes of the cervical spine. Electronically Signed   By: Luke Bun M.D.   On: 06/22/2024 17:17   DG Chest 2 View Result Date: 06/22/2024 CLINICAL DATA:  Chest tightness congestion EXAM: CHEST - 2 VIEW COMPARISON:  None Available. FINDINGS: The heart size and mediastinal contours are within normal limits. Both lungs are clear. Scoliosis of the spine. Aortic atherosclerosis. IMPRESSION: No active cardiopulmonary disease. Electronically Signed   By: Luke Bun M.D.   On: 06/22/2024 17:15    Procedures Procedures (including critical care time)  Medications Ordered in UC Medications  methylPREDNISolone  sodium succinate (SOLU-MEDROL ) 125 mg/2 mL injection 40 mg (has no administration in time range)    Initial Impression / Assessment and Plan / UC Course  I have reviewed the triage vital signs and the nursing notes.  Pertinent labs & imaging results that were available during my care of the patient were reviewed by me and considered  in my medical decision making (see chart for details).     Chest congestion - Plan: DG Chest 2 View, DG Chest 2 View  Neck pain - Plan: DG Neck Soft Tissue, DG Neck Soft Tissue  Muscle strain of chest wall, initial encounter  Sore throat  Lichen planus   X-ray done of the soft tissue of the neck and chest x-ray done today.  Final evaluation by the radiologist shows no acute processes.  Symptoms along the chest wall could be secondary to costochondritis versus other musculoskeletal type diagnosis but not likely pulmonary given the negative chest x-ray as well as the reproduction of symptoms with palpitation.  The throat symptoms have been going on for several months now.  The x-ray was negative for acute findings but highly recommend keeping follow-up appointment for ultrasound and with ENT.  For the rash and itching we can try hydroxyzine  by mouth as well as trying a higher potency topical steroid. Medrol  40 mg injection  given today. This is a steroid to help with inflammation and pain. Hydroxyzine  25 mg every 6 hours as needed for itching.  Use caution as this medication can cause drowsiness. Start tomorrow: Prednisone  40 mg (2 tablets) once daily for 5 days. Take this in the morning.  This is a steroid to help with inflammation and pain. Make sure to monitor your blood sugars closely while you are on the steroids. Clobetasol  cream twice daily to the affected areas as needed for itching but do not apply to the neck or face. Keep appointment for soft tissue ultrasound of the neck Keep follow-up appointments with gastroenterology, ENT and primary care. May return to urgent care as needed.   Final Clinical Impressions(s) / UC Diagnoses   Final diagnoses:  Chest congestion  Neck pain  Muscle strain of chest wall, initial encounter  Sore throat  Lichen planus     Discharge Instructions      X-ray done of the soft tissue of the neck and chest x-ray done today.  Final evaluation by  the radiologist shows no acute processes.  Symptoms along the chest wall could be secondary to costochondritis versus other musculoskeletal type diagnosis but not likely pulmonary given the negative chest x-ray as well as the reproduction of symptoms with palpitation.  The throat symptoms have been going on for several months now.  The x-ray was negative for acute findings but highly recommend keeping follow-up appointment for ultrasound and with ENT.  For the rash and itching we can try hydroxyzine  by mouth as well as trying a higher potency topical steroid. Medrol  40 mg injection given today. This is a steroid to help with inflammation and pain. Hydroxyzine  25 mg every 6 hours as needed for itching.  Use caution as this medication can cause drowsiness. Start tomorrow: Prednisone  40 mg (2 tablets) once daily for 5 days. Take this in the morning.  This is a steroid to help with inflammation and pain. Make sure to monitor your blood sugars closely while you are on the steroids. Clobetasol  cream twice daily to the affected areas as needed for itching but do not apply to the neck or face. Keep appointment for soft tissue ultrasound of the neck Keep follow-up appointments with gastroenterology, ENT and primary care. May return to urgent care as needed.      ED Prescriptions     Medication Sig Dispense Auth. Provider   hydrOXYzine  (ATARAX ) 25 MG tablet Take 1 tablet (25 mg total) by mouth every 6 (six) hours as needed. 12 tablet Kostas Marrow A, PA-C   clobetasol  cream (TEMOVATE ) 0.05 % Apply 1 Application topically 2 (two) times daily. 60 g Houa Nie A, PA-C   predniSONE  (DELTASONE ) 20 MG tablet Take 2 tablets (40 mg total) by mouth daily with breakfast for 5 days. 10 tablet Teresa Almarie LABOR, [place]      PDMP not reviewed this encounter.   Teresa Almarie LABOR, [place] 06/22/24 1814

## 2024-06-22 NOTE — ED Triage Notes (Signed)
 Patient here today with c/o PND, chest tightness, fatigue, and chest soreness X 3 weeks. She has taken a Z-pak with no relief. Patient has also taken prednisone  but has not been taking it due to concerns with her blood sugar.   Patient has also been having a itching rash all over. She has used Triamcinolone  and Cetaphil with some relief.

## 2024-06-26 ENCOUNTER — Ambulatory Visit: Admitting: Family Medicine

## 2024-06-27 ENCOUNTER — Ambulatory Visit (INDEPENDENT_AMBULATORY_CARE_PROVIDER_SITE_OTHER): Admitting: Family Medicine

## 2024-06-27 ENCOUNTER — Encounter: Payer: Self-pay | Admitting: Family Medicine

## 2024-06-27 VITALS — BP 118/70 | HR 85 | Resp 12 | Ht <= 58 in | Wt 96.4 lb

## 2024-06-27 DIAGNOSIS — R070 Pain in throat: Secondary | ICD-10-CM | POA: Diagnosis not present

## 2024-06-27 DIAGNOSIS — R0982 Postnasal drip: Secondary | ICD-10-CM

## 2024-06-27 DIAGNOSIS — M797 Fibromyalgia: Secondary | ICD-10-CM | POA: Diagnosis not present

## 2024-06-27 DIAGNOSIS — L299 Pruritus, unspecified: Secondary | ICD-10-CM | POA: Diagnosis not present

## 2024-06-27 DIAGNOSIS — R0789 Other chest pain: Secondary | ICD-10-CM

## 2024-06-27 MED ORDER — HYDROXYZINE HCL 25 MG PO TABS: 25.0000 mg | ORAL_TABLET | Freq: Three times a day (TID) | ORAL | 1 refills | Status: DC | PRN

## 2024-06-27 MED ORDER — FLUTICASONE PROPIONATE 50 MCG/ACT NA SUSP: 1.0000 | Freq: Every day | NASAL | 3 refills | Status: AC

## 2024-06-27 MED ORDER — SAVELLA TITRATION PACK 12.5 & 25 & 50 MG PO MISC: ORAL | 0 refills | Status: DC

## 2024-06-27 NOTE — Assessment & Plan Note (Signed)
 This problem could be causing generalized myalgias, chest wall pain, and fatigue she reported today. She is no longer on gabapentin . We discussed other treatment options, including duloxetine and Savella , she agrees with trying the form and one. Low impact regular physical activity and good sleep hygiene recommended. Follow-up in 3 months, before if needed.

## 2024-06-27 NOTE — Progress Notes (Unsigned)
 ACUTE VISIT Chief Complaint  Patient presents with   Rash    improving   post nasal drip    Still having congestion & throat tightness; interested in CT scan    HPI: [name] is a 58 y.o. female with a PMHx significant for GERD, HTN, HLD, latent autoimmune DM, hypothyroidism, insomnia, lichen planus, SIBO and fibromyalgia, who is here today complaining of Pruritus; Congestion/Post-nasal Drip.   Pruritus Initially seen for this issue in this office on 06/11/2024, was prescribed Kenalog  cream, and was then seen at [facility] on 7/04 where she was prescribed Clobetasol , Hydroxyzine , and Prednisone .  Throat Discomfort/Anterior Neck Pain; Congestion/Post-nasal Drip:  All these issues have been ongoing for several months now, for which she was also seen for in this office on 6/23.   Also seen by Dr. Lonni Phillips, GI with [facility] for consultation regarding some of these issues.  She says that it feels like her congestion and post-nasal drip is contributing to ear congestion and chest tightness she's been having recently.  She mentions having a retention maxillary mucus cyst seen on imaging years ago, wonders if this may be causing some of throat discomfort and congestion.  Denies cough, SOB, or wheezing  Lab Results  Component Value Date   TSH 2.60 06/11/2024   Lab Results  Component Value Date   NA 137 06/11/2024   CL 102 06/11/2024   K 4.1 06/11/2024   CO2 27 06/11/2024   BUN 11 06/11/2024   CREATININE 0.60 06/11/2024   GFR 99.63 06/11/2024   CALCIUM  9.7 06/11/2024   ALBUMIN 4.2 06/11/2024   GLUCOSE 89 06/11/2024   Lab Results  Component Value Date   WBC 6.9 06/11/2024   HGB 12.6 06/11/2024   HCT 38.4 06/11/2024   MCV 88.9 06/11/2024   PLT 282.0 06/11/2024   Neck soft tissue X ray and Chest X ray done on 06/22/24 were negative for acute abnormality.Degenerative changes of the cervical spine.  Chest Wall/Bilateral Rib Pain: It was severe, exacerbated  by movement and palpation, specially rib cages and lateral chest wall and back.  Says that parenteral steroid and Prednisone  she was given in the UC did help some, but is overall exasperated with this persisting pain.  She called here and Rx for abx and medrol  pack was sent, states that did not help with pain or congestion. Fibromyalgia: She discontinued Gabapentin .  She does have upcoming appointments with GI on 7/11, ENT on 9/15, and Dermatology 12/27/2024.   No longer taking Forteo due to worsening LE's myalgias while on med and says she is trying to reduce her medications in case any of her symptoms may be attributed/contributed by them.   Review of Systems  HENT:  Positive for congestion and postnasal drip.   Musculoskeletal:  Positive for myalgias.       (+) Chest Wall/Bilateral Rib Pain  Skin:        (+) Itching   See other pertinent positives and negatives in HPI.  Current Outpatient Medications on File Prior to Visit  Medication Sig Dispense Refill   Calcium  Carb-Cholecalciferol (CALCIUM  1000 + D) 1000-800 MG-UNIT TABS Take 1 tablet by mouth as needed.     cholecalciferol (VITAMIN D3) 25 MCG (1000 UNIT) tablet Take 1 tablet by mouth. Three times a week as needed (Patient taking differently: Take 1 tablet by mouth as needed. Three times a week as needed)     clobetasol  cream (TEMOVATE ) 0.05 % Apply 1 Application topically 2 (  two) times daily. 60 g 2   cyanocobalamin  (VITAMIN B12) 1000 MCG/ML injection Inject 1 mL (1,000 mcg total) into the muscle every 30 (thirty) days. 3 mL 2   insulin aspart (FIASP FLEXTOUCH) 100 UNIT/ML FlexTouch Pen Inject 4 U in the morning, 5 U at lunch, and 4 U in the evening.     insulin degludec (TRESIBA) 100 UNIT/ML FlexTouch Pen Inject 5 Units into the skin at bedtime.     levothyroxine  (SYNTHROID ) 88 MCG tablet 1/2 tab Tuesdays and Thursdays. 1 tab the rest of days 180 tablet 0   losartan  (COZAAR ) 50 MG tablet Take 1 tablet (50 mg total) by mouth daily.  90 tablet 2   rosuvastatin  (CRESTOR ) 10 MG tablet Take 1 tablet (10 mg total) by mouth daily. 90 tablet 3   sucralfate  (CARAFATE ) 1 g tablet Take 1 tablet (1 g total) by mouth 3 (three) times daily before meals. An hour before meals. 90 tablet 0   Teriparatide, Recombinant, (FORTEO) 600 MCG/2.4ML SOPN Inject into the skin daily.     Tofacitinib Citrate (XELJANZ) 5 MG TABS Take 1 tablet by mouth daily at 12 noon.     triamcinolone  cream (KENALOG ) 0.1 % Apply 1 Application topically 2 (two) times daily as needed. 80 g 0   zolpidem  (AMBIEN  CR) 12.5 MG CR tablet Take 1 tablet (12.5 mg total) by mouth at bedtime as needed. for sleep 90 tablet 0   No current facility-administered medications on file prior to visit.    Past Medical History:  Diagnosis Date   Fibromyalgia    GERD (gastroesophageal reflux disease)    HLD (hyperlipidemia)    Hypoglycemia    Hypothyroidism    Insomnia    Prediabetes    Allergies  Allergen Reactions   Lexapro [Escitalopram] Itching   Omeprazole Magnesium     Other Reaction(s): choking, burning throat   Other     Patient does not want any narcotics- makes her itch.   Soma [Carisoprodol] Itching   Tramadol Itching    Ultram    Social History   Socioeconomic History   Marital status: Single    Spouse name: Not on file   Number of children: 0   Years of education: Not on file   Highest education level: Not on file  Occupational History   Occupation: retired  Tobacco Use   Smoking status: Never   Smokeless tobacco: Never  Vaping Use   Vaping status: Never Used  Substance and Sexual Activity   Alcohol use: Not Currently   Drug use: Never   Sexual activity: Not Currently  Other Topics Concern   Not on file  Social History Narrative   Not on file   Social Drivers of Health   Financial Resource Strain: Low Risk  (08/07/2021)   Received from [facility]   Overall Financial Resource Strain (CARDIA)    Difficulty of Paying Living Expenses: Not  hard at all  Food Insecurity: Low Risk  (03/30/2024)   Received from [facility]   Hunger Vital Sign    Within the past 12 months, you worried that your food would run out before you got money to buy more: Never true    Within the past 12 months, the food you bought just didn't last and you didn't have money to get more. : Never true  Transportation Needs: No Transportation Needs (03/30/2024)   Received from [facility]    In the past 12 months, has lack of  reliable transportation kept you from medical appointments, meetings, work or from getting things needed for daily living? : No  Physical Activity: Inactive (08/07/2021)   Received from [facility]   Exercise Vital Sign    On average, how many days per week do you engage in moderate to strenuous exercise (like a brisk walk)?: 0 days    On average, how many minutes do you engage in exercise at this level?: 0 min  Stress: No Stress Concern Present (08/07/2021)   Received from [facility] - Occupational Stress Questionnaire    Feeling of Stress : Not at all  Social Connections: Unknown (04/26/2022)   Received from [facility]   Social Network    Social Network: Not on file    Vitals:   06/27/24 1347  BP: 118/70  Pulse: 85  Resp: 12  SpO2: 98%   Body mass index is 20.14 kg/m.  Physical Exam Vitals and nursing note reviewed.  Constitutional:      General: She is not in acute distress.    Appearance: She is well-developed. She is not ill-appearing.  HENT:     Head: Normocephalic and atraumatic.     Right Ear: Tympanic membrane, ear canal and external ear normal.     Left Ear: Tympanic membrane, ear canal and external ear normal.     Mouth/Throat:     Mouth: Mucous membranes are moist.     Pharynx: Oropharynx is clear.  Eyes:     Conjunctiva/sclera: Conjunctivae normal.  Cardiovascular:     Rate and Rhythm: Normal rate and regular rhythm.     Heart sounds:  No murmur heard. Pulmonary:     Effort: Pulmonary effort is normal. No respiratory distress.     Breath sounds: Normal breath sounds. No stridor.  Chest:     Chest wall: Tenderness (with deep breathing and movement) present.  Musculoskeletal:     Cervical back: Tenderness present. No edema or erythema. No muscular tenderness.     Comments: Tender trigger points upper back.  Lymphadenopathy:     Head:     Right side of head: No submandibular adenopathy.     Left side of head: No submandibular adenopathy.     Cervical: No cervical adenopathy.  Skin:    General: Skin is warm.     Findings: No erythema or rash.  Neurological:     General: No focal deficit present.     Mental Status: She is alert and oriented to person, place, and time.     Gait: Gait normal.  Psychiatric:        Mood and Affect: Mood is anxious.     ASSESSMENT AND PLAN: Ms.Breauna R Kilner was seen here today for Pruritus; congestion/post-nasal Drip/chest tightness.   Other chest pain Reported as constant tightness sensation, not exertional. We discussed possible causes, pain aggravated by palpation and movement, so could be related with fibromyalgia. Because persistent and some lower extremity pain that has improved after stopping Forteo, recommend chest CTA.  -     CT Angio Chest Pulmonary Embolism (PE) W or WO Contrast; Future  Throat discomfort Discomfort on anterior upper aspect. On examination I do not appreciate neck masses or other abnormalities to explain discomfort, last visit we discussed that could be related to scarring changes. She has had this problem for some time.  Soft tissue neck US  was ordered recently, she has not received information about imaging yet, she prefers to have a  CT, order placed. She has an appointment with ENT 08/2024. Monitor for new symptoms. Instructed about warning signs.  -     CT SOFT TISSUE NECK W CONTRAST; Future  Fibromyalgia Assessment & Plan: This problem could be  causing generalized myalgias, chest wall pain, and fatigue she reported today. She is no longer on gabapentin . We discussed other treatment options, including duloxetine and Savella , she agrees with trying the form and one. Low impact regular physical activity and good sleep hygiene recommended. Follow-up in 3 months, before if needed.  Orders: -     Savella  Titration Pack; 12.5 mg daily x 1 day, 12.5 mg bid x 2 days,25 mg bid x 4 days, then 50 mg bid.  Dispense: 1 each; Refill: 0  Postnasal discharge This could be causing the throat discomfort. Recommend Flonase  nasal spray daily at bedtime for 10 to 14 days then as needed. Keep appointment with ENT in 08/2024. -     Fluticasone  Propionate; Place 1-2 sprays into both nostrils at bedtime.  Dispense: 16 g; Refill: 3  Generalized pruritus With no associated rash. Combination of Eucerin and triamcinolone  seems to help some but problem greatly improved while on systemic steroid treatment. She has an appointment with dermatologist in 12/2023. She would like to continue hydroxyzine , which was recommended in UC, we discussed some side effects.  -     hydrOXYzine  HCl; Take 1 tablet (25 mg total) by mouth every 8 (eight) hours as needed.  Dispense: 30 tablet; Refill: 1  I spent a total of 43 minutes in both face to face and non face to face activities for this visit on the date of this encounter. During this time history was obtained and documented, examination was performed, prior labs/imaging reviewed***, and assessment/plan discussed.  Return in about 2 months (around 08/28/2024).  I,Emily Lagle,acting as a Neurosurgeon for Sareena Odeh [place], MD.,have documented all relevant documentation on the behalf of Rola Lennon [place], MD,as directed by  Teddie Curd [place], MD while in the presence of Jermey Closs [place], MD.  I, Sharunda Salmon [place], MD, have reviewed all documentation for this visit. The documentation on 06/27/24 for the exam, diagnosis, procedures, and orders are all accurate  and complete. Semaya Vida G. [place], MD  [facility]. [place] office.

## 2024-06-27 NOTE — Patient Instructions (Addendum)
 A few things to remember from today's visit:  Other chest pain - Plan: CT Angio Chest W/Cm &/Or Wo Cm  Throat discomfort - Plan: CT Soft Tissue Neck W Contrast  Fibromyalgia - Plan: Milnacipran  HCl (SAVELLA  TITRATION PACK) 12.5 & 25 & 50 MG MISC  Postnasal discharge - Plan: fluticasone  (FLONASE ) 50 MCG/ACT nasal spray  Generalized pruritus - Plan: hydrOXYzine  (ATARAX ) 25 MG tablet Try Flonase  at bedtime. Hydroxyzine  12.5-25 mg 3 times per day as needed, causes drowsiness. Savella  starting pack sent for fibromyalgia.  If you need refills for medications you take chronically, please call your pharmacy. Do not use My Chart to request refills or for acute issues that need immediate attention. If you send a my chart message, it may take a few days to be addressed, specially if I am not in the office.  Please be sure medication list is accurate. If a new problem present, please set up appointment sooner than planned today.

## 2024-06-28 ENCOUNTER — Telehealth: Payer: Self-pay

## 2024-06-28 ENCOUNTER — Other Ambulatory Visit (HOSPITAL_COMMUNITY): Payer: Self-pay

## 2024-06-28 NOTE — Telephone Encounter (Signed)
 Pharmacy Patient Advocate Encounter   Received notification from Onbase that prior authorization for Savella  Titration Pack 12.5 & 25 & 50MG  is required/requested.   Insurance verification completed.   The patient is insured through [facility] .   Per test claim: PA required and submitted KEY/EOC/Request #: [id number] from 06/28/24 to 12/25/24. Ran test claim, Copay is $251.04. This test claim was processed through [facility]- copay amounts may vary at other pharmacies due to pharmacy/plan contracts, or as the patient moves through the different stages of their insurance plan.

## 2024-06-29 DIAGNOSIS — R140 Abdominal distension (gaseous): Secondary | ICD-10-CM | POA: Diagnosis not present

## 2024-06-29 DIAGNOSIS — K638219 Small intestinal bacterial overgrowth, unspecified: Secondary | ICD-10-CM | POA: Diagnosis not present

## 2024-06-29 DIAGNOSIS — L439 Lichen planus, unspecified: Secondary | ICD-10-CM | POA: Diagnosis not present

## 2024-06-29 DIAGNOSIS — R110 Nausea: Secondary | ICD-10-CM | POA: Diagnosis not present

## 2024-06-29 DIAGNOSIS — K219 Gastro-esophageal reflux disease without esophagitis: Secondary | ICD-10-CM | POA: Diagnosis not present

## 2024-06-29 NOTE — Addendum Note (Signed)
 Addended by: EVELINE LAURAINE BRAVO on: 06/29/2024 07:59 AM   Modules accepted: Orders

## 2024-07-03 ENCOUNTER — Encounter: Payer: Self-pay | Admitting: Family Medicine

## 2024-07-03 DIAGNOSIS — Z1231 Encounter for screening mammogram for malignant neoplasm of breast: Secondary | ICD-10-CM | POA: Diagnosis not present

## 2024-07-03 DIAGNOSIS — M810 Age-related osteoporosis without current pathological fracture: Secondary | ICD-10-CM | POA: Diagnosis not present

## 2024-07-03 DIAGNOSIS — Z780 Asymptomatic menopausal state: Secondary | ICD-10-CM | POA: Diagnosis not present

## 2024-07-03 LAB — HM MAMMOGRAPHY

## 2024-07-07 ENCOUNTER — Ambulatory Visit (HOSPITAL_BASED_OUTPATIENT_CLINIC_OR_DEPARTMENT_OTHER)

## 2024-07-10 ENCOUNTER — Encounter: Payer: Self-pay | Admitting: Family Medicine

## 2024-07-10 DIAGNOSIS — K638219 Small intestinal bacterial overgrowth, unspecified: Secondary | ICD-10-CM | POA: Diagnosis not present

## 2024-07-10 DIAGNOSIS — L439 Lichen planus, unspecified: Secondary | ICD-10-CM | POA: Diagnosis not present

## 2024-07-10 DIAGNOSIS — K224 Dyskinesia of esophagus: Secondary | ICD-10-CM | POA: Diagnosis not present

## 2024-07-11 ENCOUNTER — Other Ambulatory Visit: Payer: Self-pay | Admitting: Family Medicine

## 2024-07-11 DIAGNOSIS — M797 Fibromyalgia: Secondary | ICD-10-CM

## 2024-07-11 MED ORDER — GABAPENTIN 300 MG PO CAPS: 300.0000 mg | ORAL_CAPSULE | Freq: Three times a day (TID) | ORAL | 1 refills | Status: DC

## 2024-07-16 ENCOUNTER — Encounter: Payer: Self-pay | Admitting: Family Medicine

## 2024-07-16 ENCOUNTER — Ambulatory Visit (INDEPENDENT_AMBULATORY_CARE_PROVIDER_SITE_OTHER): Admitting: Family Medicine

## 2024-07-16 VITALS — BP 100/70 | HR 85 | Resp 16 | Ht <= 58 in | Wt 96.2 lb

## 2024-07-16 DIAGNOSIS — M797 Fibromyalgia: Secondary | ICD-10-CM

## 2024-07-16 DIAGNOSIS — R0789 Other chest pain: Secondary | ICD-10-CM | POA: Diagnosis not present

## 2024-07-16 DIAGNOSIS — M79604 Pain in right leg: Secondary | ICD-10-CM

## 2024-07-16 DIAGNOSIS — M79605 Pain in left leg: Secondary | ICD-10-CM

## 2024-07-16 DIAGNOSIS — I10 Essential (primary) hypertension: Secondary | ICD-10-CM

## 2024-07-16 DIAGNOSIS — G4700 Insomnia, unspecified: Secondary | ICD-10-CM

## 2024-07-16 NOTE — Patient Instructions (Addendum)
 A few things to remember from today's visit:  Other chest pain - Plan: EKG 12-Lead  Fibromyalgia  Pain in both lower extremities  No changes today.  If you need refills for medications you take chronically, please call your pharmacy. Do not use My Chart to request refills or for acute issues that need immediate attention. If you send a my chart message, it may take a few days to be addressed, specially if I am not in the office.  Please be sure medication list is accurate. If a new problem present, please set up appointment sooner than planned today.

## 2024-07-16 NOTE — Progress Notes (Signed)
 HPI: [name] is a 58 y.o. female  PMHx significant for GERD, HTN, HLD, latent autoimmune DM, hypothyroidism, insomnia, lichen planus, SIBO and fibromyalgia here today to discuss some concerns and recent mammogram result.  Last seen on 04/30/24.  She endorses left>righ leg pain with associated muscle cramps.  She states the pain tends to radiated downward to her shin.  Her pain tends to linger around her left shin.  No edema or erythema. Fibromyalgia: She could not afford Savella , so back to Gabapentin , dose recently increased to 300 mg tid. SOB has improved. Her health insurance just approved chest CTA, which was ordered (06/29/24) initially because chest pain,SOB,and LE's pain after trip from [place].  Pt had mammogram done 715/2025.  Results showed no mammographic evidence of malignancy. Recommended she continue with regular screening annually unless otherwise clinically indicated.  No fmhx of breast cancer.  She inquires about more testing due to breast density. She is not have breast pain, skin changes,nipple discharge,or masses.  She also reports occasional chest pain, on sides and rib cages. No known triggers per pt.   HTN on Losartan  50 mg daily. She tends to monitor her BP and reports normal readings on average.  Negative for severe/frequent headache, visual changes, chest pain, dyspnea, claudication like symptoms, or focal weakness. Occasional palpitations.  Lab Results  Component Value Date   NA 137 06/11/2024   CL 102 06/11/2024   K 4.1 06/11/2024   CO2 27 06/11/2024   BUN 11 06/11/2024   CREATININE 0.60 06/11/2024   GFR 99.63 06/11/2024   CALCIUM  9.7 06/11/2024   ALBUMIN 4.2 06/11/2024   GLUCOSE 89 06/11/2024   Insomnia: Currently she is on Ambien  CR 12.5 mg daily at bedtime. Medication is still helping, sometimes 1/2 tab is enough. Sleeps about 6-7 hours.  She notes he plans to go to [place] soon for a few months and return in late October, then again  from [place].   Review of Systems  Constitutional:  Positive for fatigue. Negative for activity change, appetite change, chills and fever.  HENT:  Negative for mouth sores.   Respiratory:  Negative for cough and wheezing.   Gastrointestinal:  Negative for abdominal pain, nausea and vomiting.  Genitourinary:  Negative for decreased urine volume, dysuria and hematuria.  Musculoskeletal:  Positive for arthralgias and myalgias. Negative for gait problem.  Skin:  Negative for rash.  Neurological:  Negative for syncope and facial asymmetry.  Psychiatric/Behavioral:  Negative for confusion and hallucinations.   See other pertinent positives and negatives in HPI.  Current Outpatient Medications on File Prior to Visit  Medication Sig Dispense Refill   Calcium  Carb-Cholecalciferol (CALCIUM  1000 + D) 1000-800 MG-UNIT TABS Take 1 tablet by mouth as needed.     cholecalciferol (VITAMIN D3) 25 MCG (1000 UNIT) tablet Take 1 tablet by mouth. Three times a week as needed (Patient taking differently: Take 1 tablet by mouth as needed. Three times a week as needed)     clobetasol  cream (TEMOVATE ) 0.05 % Apply 1 Application topically 2 (two) times daily. 60 g 2   cyanocobalamin  (VITAMIN B12) 1000 MCG/ML injection Inject 1 mL (1,000 mcg total) into the muscle every 30 (thirty) days. 3 mL 2   fluticasone  (FLONASE ) 50 MCG/ACT nasal spray Place 1-2 sprays into both nostrils at bedtime. 16 g 3   gabapentin  (NEURONTIN ) 300 MG capsule Take 1 capsule (300 mg total) by mouth 3 (three) times daily. 90 capsule 1   hydrOXYzine  (ATARAX ) 25 MG tablet  Take 1 tablet (25 mg total) by mouth every 8 (eight) hours as needed. 30 tablet 1   insulin aspart (FIASP FLEXTOUCH) 100 UNIT/ML FlexTouch Pen Inject 4 U in the morning, 5 U at lunch, and 4 U in the evening.     insulin degludec (TRESIBA) 100 UNIT/ML FlexTouch Pen Inject 5 Units into the skin at bedtime.     levothyroxine  (SYNTHROID ) 88 MCG tablet 1/2 tab Tuesdays and  Thursdays. 1 tab the rest of days 180 tablet 0   losartan  (COZAAR ) 50 MG tablet Take 1 tablet (50 mg total) by mouth daily. 90 tablet 2   rosuvastatin  (CRESTOR ) 10 MG tablet Take 1 tablet (10 mg total) by mouth daily. 90 tablet 3   sucralfate  (CARAFATE ) 1 g tablet Take 1 tablet (1 g total) by mouth 3 (three) times daily before meals. An hour before meals. 90 tablet 0   Teriparatide, Recombinant, (FORTEO) 600 MCG/2.4ML SOPN Inject into the skin daily.     Tofacitinib Citrate (XELJANZ) 5 MG TABS Take 1 tablet by mouth daily at 12 noon.     triamcinolone  cream (KENALOG ) 0.1 % Apply 1 Application topically 2 (two) times daily as needed. 80 g 0   zolpidem  (AMBIEN  CR) 12.5 MG CR tablet Take 1 tablet (12.5 mg total) by mouth at bedtime as needed. for sleep 90 tablet 0   No current facility-administered medications on file prior to visit.    Past Medical History:  Diagnosis Date   Fibromyalgia    GERD (gastroesophageal reflux disease)    HLD (hyperlipidemia)    Hypoglycemia    Hypothyroidism    Insomnia    Prediabetes    Allergies  Allergen Reactions   Lexapro [Escitalopram] Itching   Omeprazole Magnesium     Other Reaction(s): choking, burning throat   Other     Patient does not want any narcotics- makes her itch.   Soma [Carisoprodol] Itching   Tramadol Itching    Ultram    Social History   Socioeconomic History   Marital status: Single    Spouse name: Not on file   Number of children: 0   Years of education: Not on file   Highest education level: Not on file  Occupational History   Occupation: retired  Tobacco Use   Smoking status: Never   Smokeless tobacco: Never  Vaping Use   Vaping status: Never Used  Substance and Sexual Activity   Alcohol use: Not Currently   Drug use: Never   Sexual activity: Not Currently  Other Topics Concern   Not on file  Social History Narrative   Not on file   Social Drivers of Health   Financial Resource Strain: Low Risk   (08/07/2021)   Received from [facility]   Overall Financial Resource Strain (CARDIA)    Difficulty of Paying Living Expenses: Not hard at all  Food Insecurity: Low Risk  (03/30/2024)   Received from [facility]   Hunger Vital Sign    Within the past 12 months, you worried that your food would run out before you got money to buy more: Never true    Within the past 12 months, the food you bought just didn't last and you didn't have money to get more. : Never true  Transportation Needs: No Transportation Needs (03/30/2024)   Received from [facility]    In the past 12 months, has lack of reliable transportation kept you from medical appointments, meetings, work or from getting things  needed for daily living? : No  Physical Activity: Inactive (08/07/2021)   Received from [facility]   Exercise Vital Sign    On average, how many days per week do you engage in moderate to strenuous exercise (like a brisk walk)?: 0 days    On average, how many minutes do you engage in exercise at this level?: 0 min  Stress: No Stress Concern Present (08/07/2021)   Received from [facility] - Occupational Stress Questionnaire    Feeling of Stress : Not at all  Social Connections: Unknown (04/26/2022)   Received from [facility]   Social Network    Social Network: Not on file    Vitals:   07/16/24 1515  BP: 100/70  Pulse: 85  Resp: 16  SpO2: 98%   Body mass index is 20.12 kg/m.  Physical Exam Vitals and nursing note reviewed.  Constitutional:      General: She is not in acute distress.    Appearance: She is well-developed.  HENT:     Head: Normocephalic and atraumatic.     Mouth/Throat:     Mouth: Mucous membranes are moist.  Eyes:     Conjunctiva/sclera: Conjunctivae normal.  Cardiovascular:     Rate and Rhythm: Normal rate and regular rhythm.     Pulses:          Dorsalis pedis pulses are 2+ on the right side and 2+ on the  left side.     Heart sounds: No murmur heard. Pulmonary:     Effort: Pulmonary effort is normal. No respiratory distress.     Breath sounds: Normal breath sounds.  Chest:     Chest wall: Tenderness present.  Abdominal:     Palpations: Abdomen is soft. There is no mass.     Tenderness: There is no abdominal tenderness.  Musculoskeletal:     Right lower leg: No edema.     Left lower leg: No edema.     Comments: Tender trigger points on upper and lower back,chest wall,and extremities. No signs of synovitis.  Lymphadenopathy:     Cervical: No cervical adenopathy.  Skin:    General: Skin is warm.     Findings: No erythema or rash.  Neurological:     General: No focal deficit present.     Mental Status: She is alert and oriented to person, place, and time.     Cranial Nerves: No cranial nerve deficit.     Gait: Gait normal.  Psychiatric:        Mood and Affect: Affect normal. Mood is anxious.   ASSESSMENT AND PLAN: Ms. GIANNI FUCHS was seen today for choric disease management. .  Pain in both lower extremities Problem has been going on for a few months, hx and examination do not suggest a serious process. Could be related to her hx of fibromyalgia. No numbness or tingling. Monitor for new symptoms. I do not think further work up is necessary at this time.  Other chest pain We reviewed possible etiologies. It seems more musculoskeletal. EKG done today NSR, normal axis and intervals, no signs of ischemia. No other EKG available for comparison.  -     EKG 12-Lead  Fibromyalgia Assessment & Plan: Continue Gabapentin  300 mg tid, recently dose was increased. Good sleep hygiene and low impact physical activity.  Insomnia, unspecified type Assessment & Plan: Problem is well controlled. Continue Ambien  CR 12.5 mg daily at bedtime as needed and adequate  sleep hygiene. PDMP reviewed.  Essential (primary) hypertension Assessment & Plan: SBP low normal range. Continue  Losartan  50 mg daily and low salt diet. Continue monitoring blood pressure at home.  In regard to mammogram, it was negative and radiologist recommended annual follow up.  Return in about 4 months (around 11/16/2024) for chronic problems.  I, Vernell Forest, acting as a scribe for Negin Hegg [place], MD., have documented all relevant documentation on the behalf of Ash Mcelwain [place], MD, as directed by   while in the presence of Taten Merrow [place], MD.  I, Lavonna Lampron [place], MD, have reviewed all documentation for this visit. The documentation on 07/16/24 for the exam, diagnosis, procedures, and orders are all accurate and complete.  Xochilth Standish G. [place], MD  [facility]

## 2024-07-18 DIAGNOSIS — K638219 Small intestinal bacterial overgrowth, unspecified: Secondary | ICD-10-CM | POA: Diagnosis not present

## 2024-07-19 ENCOUNTER — Encounter: Payer: Self-pay | Admitting: Family Medicine

## 2024-07-19 NOTE — Assessment & Plan Note (Signed)
 Continue Gabapentin  300 mg tid, recently dose was increased. Good sleep hygiene and low impact physical activity.

## 2024-07-19 NOTE — Assessment & Plan Note (Signed)
 SBP low normal range. Continue Losartan  50 mg daily and low salt diet. Continue monitoring blood pressure at home.

## 2024-07-19 NOTE — Assessment & Plan Note (Signed)
 Problem is well controlled. Continue Ambien  CR 12.5 mg daily at bedtime as needed and adequate sleep hygiene. PDMP reviewed.

## 2024-07-20 ENCOUNTER — Encounter: Payer: Self-pay | Admitting: Neurology

## 2024-07-22 ENCOUNTER — Other Ambulatory Visit: Payer: Self-pay | Admitting: Family Medicine

## 2024-07-22 ENCOUNTER — Encounter: Payer: Self-pay | Admitting: Family Medicine

## 2024-07-22 DIAGNOSIS — G4700 Insomnia, unspecified: Secondary | ICD-10-CM

## 2024-07-23 DIAGNOSIS — L650 Telogen effluvium: Secondary | ICD-10-CM | POA: Diagnosis not present

## 2024-07-23 DIAGNOSIS — L661 Lichen planopilaris, unspecified: Secondary | ICD-10-CM | POA: Diagnosis not present

## 2024-07-24 MED ORDER — ZOLPIDEM TARTRATE ER 12.5 MG PO TBCR: 12.5000 mg | EXTENDED_RELEASE_TABLET | Freq: Every evening | ORAL | 0 refills | Status: DC | PRN

## 2024-07-25 ENCOUNTER — Encounter: Payer: Self-pay | Admitting: Family Medicine

## 2024-10-27 ENCOUNTER — Other Ambulatory Visit: Payer: Self-pay | Admitting: Family Medicine

## 2024-10-27 DIAGNOSIS — G4700 Insomnia, unspecified: Secondary | ICD-10-CM

## 2024-10-29 DIAGNOSIS — Z5181 Encounter for therapeutic drug level monitoring: Secondary | ICD-10-CM | POA: Diagnosis not present

## 2024-10-29 DIAGNOSIS — L438 Other lichen planus: Secondary | ICD-10-CM | POA: Diagnosis not present

## 2024-10-30 ENCOUNTER — Encounter

## 2024-10-30 DIAGNOSIS — R070 Pain in throat: Secondary | ICD-10-CM | POA: Diagnosis not present

## 2024-10-30 DIAGNOSIS — R1319 Other dysphagia: Secondary | ICD-10-CM | POA: Diagnosis not present

## 2024-10-30 DIAGNOSIS — R09A2 Foreign body sensation, throat: Secondary | ICD-10-CM | POA: Diagnosis not present

## 2024-10-31 ENCOUNTER — Ambulatory Visit: Admitting: Neurology

## 2024-10-31 DIAGNOSIS — M2619 Other specified anomalies of jaw-cranial base relationship: Secondary | ICD-10-CM

## 2024-10-31 DIAGNOSIS — G4733 Obstructive sleep apnea (adult) (pediatric): Secondary | ICD-10-CM

## 2024-10-31 DIAGNOSIS — M2604 Mandibular hypoplasia: Secondary | ICD-10-CM

## 2024-10-31 DIAGNOSIS — F5104 Psychophysiologic insomnia: Secondary | ICD-10-CM

## 2024-10-31 DIAGNOSIS — R0683 Snoring: Secondary | ICD-10-CM

## 2024-11-01 DIAGNOSIS — E119 Type 2 diabetes mellitus without complications: Secondary | ICD-10-CM | POA: Diagnosis not present

## 2024-11-01 NOTE — Progress Notes (Signed)
 [facility]  [facility] [place]    HOME SLEEP TEST REPORT ( by Watch PAT)   STUDY DATE:  10-31-2024    ORDERING CLINICIAN: Dedra Gores, MD  REFERRING CLINICIAN: Betty Jordan , MD    CLINICAL INFORMATION/HISTORY: patient with small mandible, retrognathia and insomnia. Fibromyalgia, dysphagia,  DM 1.   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  Had a sleep study  13 years ago and was mildly apnoeic  but did decide against treatment at the time.  She tried a dental device  in 2013 , was not liking it.    In 2023  she contracted Covid, became a type 1 diabetic and was also dx  with  lichen planus.    She has 2 sisters and both have now been dx with sleep apnea, one had  severe headaches , and feels better since she is on CPAP. One sister was very sleepy and now is not sleepy anymore.    Lately, nobody wants  to share a hotel room with me, I am snoring so loud.  As I am not working , and have the opportunity  to take a nap, yet  I just don't sleep.  I have Insomnia  I was very sick for while:  GI ( GERD) , esophageal erosion, and pulmonary ( congestion) symptoms.  Dx of oral lichen planus  fibromyalgia, autoimmune issues  I can't eat anything spicy , and I am now also very temperature sensitive to my food . I am taking gabapentin   100 mg  prescribed up to 2 at night, made me groggy, and now I use it prn .   Epworth sleepiness score: 4/ 24 points. FSS endorsed at 46/ 63 points.    BMI: 19.9  kg/m.  Neck Circumference: 12    Sleep Summary:   Total Recording Time (hours, min):  7 h 41 minutes      Total Sleep Time (hours, min): 7 h 11 minutes                  Percent REM (%):     24.7%                                    Respiratory Indices:   Calculated pAHI (per AASM guideline): 27.3/h                          REM pAHI:   33/h                                               NREM pAHI:  25/h                              Positional AHI:    59.4/h in supine.   Snoring:    The  mean Volume was loud :46 dB, and loudest in supine.  Oxygen Saturation Statistics:   Oxygen Saturation (%) Mean:   95 % ,between  80 % and 100 %           O2 Saturation (minutes) <89%:   2.5 minutes.         Pulse Rate Statistics:   Pulse Mean (bpm):  77 bpm, between 58 and 111 bpm.                         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 Sleep hypoxia was also supine dependent.  Loud snoring was recorded .   RECOMMENDATION: I recommend to avoid supine sleep as step number 1.  I also want to give the patient the option of using a dental device for mandibular advancement first  and if this failed , I would recommend to  treat by PAP.   The patient is very petite, has a small neck and a very small upper airway.  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   Should she have tried dental devices in the past, I would try auto CPAP next ( 5- 12 cm water , 1 cm EPR . Chin strap and nasal interface avoiding additional pressure on chin position) .      Any patient should be cautioned not to drive, work at heights, or operate dangerous or heavy equipment when tired or sleepy.  Insomnia  does not automatically improve when apnea is treated successfully.  Review of good sleep hygiene measures is accessible to any sleep clinic patient and can be reiterated through online material- I we recommend the Guide to better Sleep   by the NIH.  The referring physician will be notified of the test results.       INTERPRETING PHYSICIAN:  Dedra Gores, MD  [facility] Sleep Board certified by The [employer] of Sleep Medicine and Diplomate of the [employer] of  Sleep Medicine. Board certified In Neurology through the ABPN, Fellow of the [employer] of Neurology.

## 2024-11-02 ENCOUNTER — Ambulatory Visit: Admitting: Family Medicine

## 2024-11-05 ENCOUNTER — Telehealth: Payer: Self-pay | Admitting: Neurology

## 2024-11-05 DIAGNOSIS — K219 Gastro-esophageal reflux disease without esophagitis: Secondary | ICD-10-CM | POA: Diagnosis not present

## 2024-11-05 DIAGNOSIS — K449 Diaphragmatic hernia without obstruction or gangrene: Secondary | ICD-10-CM | POA: Diagnosis not present

## 2024-11-05 DIAGNOSIS — K2950 Unspecified chronic gastritis without bleeding: Secondary | ICD-10-CM | POA: Diagnosis not present

## 2024-11-05 DIAGNOSIS — K208 Other esophagitis without bleeding: Secondary | ICD-10-CM | POA: Diagnosis not present

## 2024-11-05 DIAGNOSIS — K638219 Small intestinal bacterial overgrowth, unspecified: Secondary | ICD-10-CM | POA: Diagnosis not present

## 2024-11-05 DIAGNOSIS — K2289 Other specified disease of esophagus: Secondary | ICD-10-CM | POA: Diagnosis not present

## 2024-11-05 DIAGNOSIS — K3189 Other diseases of stomach and duodenum: Secondary | ICD-10-CM | POA: Diagnosis not present

## 2024-11-05 DIAGNOSIS — L439 Lichen planus, unspecified: Secondary | ICD-10-CM | POA: Diagnosis not present

## 2024-11-05 NOTE — Telephone Encounter (Signed)
   FYI..  Pt called stating that she wanted to see if Md got Sleep Study result s, Informed Pt that MD has not reviewed results . Once reviewed  MD will call

## 2024-11-07 ENCOUNTER — Ambulatory Visit: Admitting: Family Medicine

## 2024-11-07 DIAGNOSIS — K638219 Small intestinal bacterial overgrowth, unspecified: Secondary | ICD-10-CM | POA: Diagnosis not present

## 2024-11-07 DIAGNOSIS — K449 Diaphragmatic hernia without obstruction or gangrene: Secondary | ICD-10-CM | POA: Diagnosis not present

## 2024-11-07 DIAGNOSIS — L439 Lichen planus, unspecified: Secondary | ICD-10-CM | POA: Diagnosis not present

## 2024-11-07 DIAGNOSIS — K2289 Other specified disease of esophagus: Secondary | ICD-10-CM | POA: Diagnosis not present

## 2024-11-07 DIAGNOSIS — K219 Gastro-esophageal reflux disease without esophagitis: Secondary | ICD-10-CM | POA: Diagnosis not present

## 2024-11-08 ENCOUNTER — Telehealth: Payer: Self-pay | Admitting: Family Medicine

## 2024-11-08 NOTE — Telephone Encounter (Signed)
 Patient has late canceled/no showed 3 appointments since 01/09/24 which qualifies for dismissal. Please advise, thanks

## 2024-11-09 ENCOUNTER — Encounter: Payer: Self-pay | Admitting: Neurology

## 2024-11-09 ENCOUNTER — Ambulatory Visit: Admitting: Family Medicine

## 2024-11-09 DIAGNOSIS — R059 Cough, unspecified: Secondary | ICD-10-CM | POA: Diagnosis not present

## 2024-11-09 DIAGNOSIS — R079 Chest pain, unspecified: Secondary | ICD-10-CM | POA: Diagnosis not present

## 2024-11-09 DIAGNOSIS — R1110 Vomiting, unspecified: Secondary | ICD-10-CM | POA: Diagnosis not present

## 2024-11-11 ENCOUNTER — Ambulatory Visit: Payer: Self-pay | Admitting: Neurology

## 2024-11-11 NOTE — Procedures (Signed)
 [facility]  [facility] [place]    HOME SLEEP TEST REPORT ( by Watch PAT)   STUDY DATE:  10-31-2024    ORDERING CLINICIAN: Dedra Gores, MD  REFERRING CLINICIAN: Betty Jordan , MD    CLINICAL INFORMATION/HISTORY: patient with small mandible, retrognathia and insomnia. Fibromyalgia, dysphagia,  DM 1.   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  Had a sleep study  13 years ago and was mildly apnoeic  but did decide against treatment at the time.  She tried a dental device  in 2013 , was not liking it.    In 2023  she contracted Covid, became a type 1 diabetic and was also dx  with  lichen planus.    She has 2 sisters and both have now been dx with sleep apnea, one had  severe headaches , and feels better since she is on CPAP. One sister was very sleepy and now is not sleepy anymore.    Lately, nobody wants  to share a hotel room with me, I am snoring so loud.  As I am not working , and have the opportunity  to take a nap, yet  I just don't sleep.  I have Insomnia  I was very sick for while:  GI ( GERD) , esophageal erosion, and pulmonary ( congestion) symptoms.  Dx of oral lichen planus  fibromyalgia, autoimmune issues  I can't eat anything spicy , and I am now also very temperature sensitive to my food . I am taking gabapentin   100 mg  prescribed up to 2 at night, made me groggy, and now I use it prn .   Epworth sleepiness score: 4/ 24 points. FSS endorsed at 46/ 63 points.    BMI: 19.9  kg/m.  Neck Circumference: 12    Sleep Summary:   Total Recording Time (hours, min):  7 h 41 minutes      Total Sleep Time (hours, min): 7 h 11 minutes                  Percent REM (%):     24.7%                                    Respiratory Indices:   Calculated pAHI (per AASM guideline): 27.3/h                          REM pAHI:   33/h                                               NREM pAHI:  25/h                              Positional AHI:    59.4/h in supine.   Snoring:    The  mean Volume was loud :46 dB, and loudest in supine.  Oxygen Saturation Statistics:   Oxygen Saturation (%) Mean:   95 % ,between  80 % and 100 %           O2 Saturation (minutes) <89%:   2.5 minutes.         Pulse Rate Statistics:   Pulse Mean (bpm):  77 bpm, between 58 and 111 bpm.                         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 Sleep hypoxia was also supine dependent.  Loud snoring was recorded .   RECOMMENDATION: I recommend to avoid supine sleep as step number 1.  I also want to give the patient the option of using a dental device for mandibular advancement first  and if this failed , I would recommend to  treat by PAP.   The patient is very petite, has a small neck and a very small upper airway.  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   Should she have tried dental devices in the past, I would try auto CPAP next ( 5- 12 cm water , 1 cm EPR . Chin strap and nasal interface avoiding additional pressure on chin position) .      Any patient should be cautioned not to drive, work at heights, or operate dangerous or heavy equipment when tired or sleepy.  Insomnia  does not automatically improve when apnea is treated successfully.  Review of good sleep hygiene measures is accessible to any sleep clinic patient and can be reiterated through online material- I we recommend the Guide to better Sleep   by the NIH.  The referring physician will be notified of the test results.       INTERPRETING PHYSICIAN:  Dedra Gores, MD  [facility] Sleep Board certified by The [employer] of Sleep Medicine and Diplomate of the [employer] of  Sleep Medicine. Board certified In Neurology through the ABPN, Fellow of the [employer] of Neurology.

## 2024-11-13 ENCOUNTER — Encounter: Payer: Self-pay | Admitting: Family Medicine

## 2024-11-13 ENCOUNTER — Ambulatory Visit

## 2024-11-13 ENCOUNTER — Ambulatory Visit (INDEPENDENT_AMBULATORY_CARE_PROVIDER_SITE_OTHER): Admitting: Family Medicine

## 2024-11-13 VITALS — BP 118/70 | HR 61 | Temp 97.9°F | Resp 16 | Ht <= 58 in | Wt 99.6 lb

## 2024-11-13 DIAGNOSIS — I10 Essential (primary) hypertension: Secondary | ICD-10-CM

## 2024-11-13 DIAGNOSIS — G4700 Insomnia, unspecified: Secondary | ICD-10-CM | POA: Diagnosis not present

## 2024-11-13 DIAGNOSIS — M797 Fibromyalgia: Secondary | ICD-10-CM

## 2024-11-13 DIAGNOSIS — M7541 Impingement syndrome of right shoulder: Secondary | ICD-10-CM | POA: Diagnosis not present

## 2024-11-13 DIAGNOSIS — J029 Acute pharyngitis, unspecified: Secondary | ICD-10-CM | POA: Diagnosis not present

## 2024-11-13 DIAGNOSIS — M25561 Pain in right knee: Secondary | ICD-10-CM

## 2024-11-13 DIAGNOSIS — K219 Gastro-esophageal reflux disease without esophagitis: Secondary | ICD-10-CM

## 2024-11-13 MED ORDER — GABAPENTIN 300 MG PO CAPS: 300.0000 mg | ORAL_CAPSULE | Freq: Every day | ORAL | Status: AC

## 2024-11-13 MED ORDER — SUCRALFATE 1 G PO TABS: 1.0000 g | ORAL_TABLET | Freq: Three times a day (TID) | ORAL | 0 refills | Status: AC

## 2024-11-13 MED ORDER — FAMOTIDINE 40 MG PO TABS: 40.0000 mg | ORAL_TABLET | Freq: Every day | ORAL | 0 refills | Status: AC

## 2024-11-13 NOTE — Assessment & Plan Note (Signed)
 She follows with gastroenterologist. Today she is requesting a prescription for sucralfate  1 g, which she takes before each meal 3 times daily; and famotidine  40 mg to take daily.  She has found out that this combination works well and controlled symptoms. She has a follow-up appointment with GI in 02/2025. Continue GERD precautions.

## 2024-11-13 NOTE — Assessment & Plan Note (Signed)
 Stable. She is not taking gabapentin  300 mg daily, last time increased to 3 times daily to help with myalgias. Recommend taking gabapentin  300 mg daily at bedtime. Continue good sleep hygiene and low impact exercise. Follow-up in 6 months.

## 2024-11-13 NOTE — Assessment & Plan Note (Signed)
 Problem is stable. Ambien  CR 12.5 mg 1/2 to 1 tablet daily at bedtime has helped, no changes today. Continue good sleep hygiene. PDMP reviewed. Follow-up in 5 to 6 months.

## 2024-11-13 NOTE — Telephone Encounter (Signed)
 She has an appt today, if no show she can be dismissed. Thanks, BJ

## 2024-11-13 NOTE — Patient Instructions (Addendum)
 A few things to remember from today's visit:  Shoulder impingement syndrome, right  Gastroesophageal reflux disease, unspecified whether esophagitis present - Plan: famotidine  (PEPCID ) 40 MG tablet, sucralfate  (CARAFATE ) 1 g tablet  Right knee pain, unspecified chronicity - Plan: DG Knee Complete 4 Views Right  Fibromyalgia - Plan: gabapentin  (NEURONTIN ) 300 MG capsule  Sore throat  Physico therapy may help with right knee and shoulder pain. If shoulder is not better you need to see sport med or ortho to discuss imaging.  If you need refills for medications you take chronically, please call your pharmacy. Do not use My Chart to request refills or for acute issues that need immediate attention. If you send a my chart message, it may take a few days to be addressed, specially if I am not in the office.  Please be sure medication list is accurate. If a new problem present, please set up appointment sooner than planned today.

## 2024-11-13 NOTE — Progress Notes (Signed)
 ACUTE VISIT Chief Complaint  Patient presents with   Sore Throat   HPI: [name] is a 58 y.o. female Discussed the use of AI scribe software for clinical note transcription with the patient, who gave verbal consent to proceed.  History of Present Illness [name] is a 58 year old female  with PMHx significant for GERD, HTN, HLD, latent autoimmune DM, hypothyroidism, insomnia, lichen planus, SIBO and fibromyalgia who is here today complaining of sore throat, right shoulder and knee pain.  She has had a sore throat for the past two weeks, accompanied by a burning sensation with swallowing, particularly when consuming cold foods or drinks. An EGD with Bravo capsule placement was performed on November 17, which she feels worsened her symptoms. She states that endoscopy showed a red area in the esophagus, but biopsy results did not confirm Barrett's esophagus. She has been gargling with warm salt water for relief. No fever, chills, or recent sick contact with anyone with strep throat. GERD: She is on Sucralfate  1 g before meals and has added Famotidine  40 mg, this combination has helped with symptoms. PPI did not help. Appt with GI in 02/2025.  -She reports right shoulder pain since November 2, following a trip from [place], where she believes she injured it while lifting a suitcase above head level. The pain is constant, rated 6/10, and she reports difficulty with movement, affecting daily activities such as driving and washing dishes. There is no swelling, but she reports significant pain with movement.  She also experiences right knee pain, which began approximately two months ago after physical therapy for scoliosis in [place]. The pain is localized to the medial aspect of the knee, worsens with walking, and is not alleviated by braces. There is no swelling or redness. She has not seen an orthopedist for this issue.  Her past medical history includes scoliosis and fibromyalgia. She  occasionally takes gabapentin  300 mg for fibromyalgia. Does not take it during the day because it makes her sleepy.  Insomnia: She takes Ambien  CR 12.5 mg 1/2-1 tab at bedtime.  Sleeps about 6-7 hours.  HTN on Losartan  50 mg daily. Denies severe/frequent headache, visual changes, chest pain, dyspnea, palpitation, focal weakness, or edema. Lab Results  Component Value Date   NA 137 06/11/2024   CL 102 06/11/2024   K 4.1 06/11/2024   CO2 27 06/11/2024   BUN 11 06/11/2024   CREATININE 0.60 06/11/2024   GFR 99.63 06/11/2024   CALCIUM  9.7 06/11/2024   ALBUMIN 4.2 06/11/2024   GLUCOSE 89 06/11/2024   Review of Systems  Constitutional:  Negative for appetite change, chills and fever.  HENT:  Negative for mouth sores and trouble swallowing.   Respiratory:  Negative for cough, wheezing and stridor.   Gastrointestinal:  Negative for abdominal pain, nausea and vomiting.  Genitourinary:  Negative for decreased urine volume, dysuria and hematuria.  Skin:  Negative for rash.  Neurological:  Negative for syncope and facial asymmetry.  Psychiatric/Behavioral:  Negative for confusion and hallucinations.   See other pertinent positives and negatives in HPI.  Current Outpatient Medications on File Prior to Visit  Medication Sig Dispense Refill   Calcium  Carb-Cholecalciferol (CALCIUM  1000 + D) 1000-800 MG-UNIT TABS Take 1 tablet by mouth as needed.     cholecalciferol (VITAMIN D3) 25 MCG (1000 UNIT) tablet Take 1 tablet by mouth. Three times a week as needed (Patient taking differently: Take 1 tablet by mouth as needed. Three times a week as  needed)     clobetasol  cream (TEMOVATE ) 0.05 % Apply 1 Application topically 2 (two) times daily. 60 g 2   cyanocobalamin  (VITAMIN B12) 1000 MCG/ML injection Inject 1 mL (1,000 mcg total) into the muscle every 30 (thirty) days. 3 mL 2   fluticasone  (FLONASE ) 50 MCG/ACT nasal spray Place 1-2 sprays into both nostrils at bedtime. 16 g 3   hydrOXYzine  (ATARAX ) 25  MG tablet Take 1 tablet (25 mg total) by mouth every 8 (eight) hours as needed. 30 tablet 1   insulin aspart (FIASP FLEXTOUCH) 100 UNIT/ML FlexTouch Pen Inject 4 U in the morning, 5 U at lunch, and 4 U in the evening.     insulin degludec (TRESIBA) 100 UNIT/ML FlexTouch Pen Inject 5 Units into the skin at bedtime.     levothyroxine  (SYNTHROID ) 88 MCG tablet 1/2 tab Tuesdays and Thursdays. 1 tab the rest of days 180 tablet 0   losartan  (COZAAR ) 50 MG tablet Take 1 tablet (50 mg total) by mouth daily. 90 tablet 2   rosuvastatin  (CRESTOR ) 10 MG tablet Take 1 tablet (10 mg total) by mouth daily. 90 tablet 3   Teriparatide, Recombinant, (FORTEO) 600 MCG/2.4ML SOPN Inject into the skin daily.     Tofacitinib Citrate (XELJANZ) 5 MG TABS Take 1 tablet by mouth daily at 12 noon.     triamcinolone  cream (KENALOG ) 0.1 % Apply 1 Application topically 2 (two) times daily as needed. 80 g 0   zolpidem  (AMBIEN  CR) 12.5 MG CR tablet TAKE 1 TABLET BY MOUTH AT BEDTIME AS NEEDED FOR SLEEP 90 tablet 0   No current facility-administered medications on file prior to visit.    Past Medical History:  Diagnosis Date   Fibromyalgia    GERD (gastroesophageal reflux disease)    HLD (hyperlipidemia)    Hypoglycemia    Hypothyroidism    Insomnia    Prediabetes    Allergies  Allergen Reactions   Lexapro [Escitalopram] Itching   Omeprazole Magnesium     Other Reaction(s): choking, burning throat   Other     Patient does not want any narcotics- makes her itch.   Soma [Carisoprodol] Itching   Tramadol Itching    Ultram    Social History   Socioeconomic History   Marital status: Single    Spouse name: Not on file   Number of children: 0   Years of education: Not on file   Highest education level: Not on file  Occupational History   Occupation: retired  Tobacco Use   Smoking status: Never   Smokeless tobacco: Never  Vaping Use   Vaping status: Never Used  Substance and Sexual Activity   Alcohol use:  Not Currently   Drug use: Never   Sexual activity: Not Currently  Other Topics Concern   Not on file  Social History Narrative   Not on file   Social Drivers of Health   Financial Resource Strain: Low Risk  (08/07/2021)   Received from [facility]   Overall Financial Resource Strain (CARDIA)    Difficulty of Paying Living Expenses: Not hard at all  Food Insecurity: Low Risk  (03/30/2024)   Received from [facility]   Hunger Vital Sign    Within the past 12 months, you worried that your food would run out before you got money to buy more: Never true    Within the past 12 months, the food you bought just didn't last and you didn't have money to get more. : Never true  Transportation Needs: No Transportation Needs (03/30/2024)   Received from [facility]    In the past 12 months, has lack of reliable transportation kept you from medical appointments, meetings, work or from getting things needed for daily living? : No  Physical Activity: Inactive (08/07/2021)   Received from [facility]   Exercise Vital Sign    On average, how many days per week do you engage in moderate to strenuous exercise (like a brisk walk)?: 0 days    On average, how many minutes do you engage in exercise at this level?: 0 min  Stress: No Stress Concern Present (08/07/2021)   Received from [facility] - Occupational Stress Questionnaire    Feeling of Stress : Not at all  Social Connections: Unknown (04/26/2022)   Received from [facility]   Social Network    Social Network: Not on file    Vitals:   11/13/24 0931  BP: 118/70  Pulse: 61  Temp: 97.9 F (36.6 C)  SpO2: 99%   Body mass index is 20.82 kg/m.  Physical Exam Vitals and nursing note reviewed.  Constitutional:      General: She is not in acute distress.    Appearance: She is well-developed.  HENT:     Head: Normocephalic and atraumatic.     Mouth/Throat:     Mouth: Mucous  membranes are moist.     Pharynx: Oropharynx is clear. No oropharyngeal exudate or posterior oropharyngeal erythema.  Eyes:     Conjunctiva/sclera: Conjunctivae normal.  Cardiovascular:     Rate and Rhythm: Normal rate and regular rhythm.     Pulses:          Dorsalis pedis pulses are 2+ on the right side and 2+ on the left side.     Heart sounds: No murmur heard. Pulmonary:     Effort: Pulmonary effort is normal. No respiratory distress.     Breath sounds: Normal breath sounds.  Abdominal:     Palpations: Abdomen is soft. There is no hepatomegaly or mass.     Tenderness: There is no abdominal tenderness.  Musculoskeletal:     Right shoulder: Tenderness present. No swelling, deformity, bony tenderness or crepitus. Decreased range of motion.     Right knee: Crepitus present. No effusion, erythema or bony tenderness. Tenderness present over the medial joint line. Normal patellar mobility.     Instability Tests: Anterior drawer test negative. Posterior drawer test negative.     Comments: Right shoulder:Hawkins' test pos, drop arm rotator cuff test pos, empty can supraspinatus test pos, cross body adduction test elicits pain, lift-Off Subscapularis test positive.. Limited active > passive ROM.  Lymphadenopathy:     Cervical: No cervical adenopathy.  Skin:    General: Skin is warm.     Findings: No erythema or rash.  Neurological:     General: No focal deficit present.     Mental Status: She is alert and oriented to person, place, and time.     Cranial Nerves: No cranial nerve deficit.     Gait: Gait normal.  Psychiatric:        Mood and Affect: Affect normal. Mood is anxious.    ASSESSMENT AND PLAN:  Ms. Cerrito was seen today for follow up and acute concerns.  Shoulder impingement syndrome, right Differential dx discussed. I do not think imaging is needed today. PT recommended, sill arrange it in [place]. If not greatly improve she is going  to need further work up with  ortho.  Gastroesophageal reflux disease, unspecified whether esophagitis present Assessment & Plan: She follows with gastroenterologist. Today she is requesting a prescription for sucralfate  1 g, which she takes before each meal 3 times daily; and famotidine  40 mg to take daily.  She has found out that this combination works well and controlled symptoms. She has a follow-up appointment with GI in 02/2025. Continue GERD precautions.  Letter provided stating that she has a BRAVO cap placed in her esophagus that may caused alarms at the airport to go off.  Orders: -     Famotidine ; Take 1 tablet (40 mg total) by mouth at bedtime.  Dispense: 90 tablet; Refill: 0 -     Sucralfate ; Take 1 tablet (1 g total) by mouth 3 (three) times daily before meals. An hour before meals.  Dispense: 180 tablet; Refill: 0  Right knee pain, unspecified chronicity 2 months of medial knee pain, no hx of trauma. ? OA, meniscal pathology among some to consider. PT may help. She is going to [place] in 2 days and will be there until 01/2025, so can arrange PT there.   -     DG Knee Complete 4 Views Right; Future  Fibromyalgia Assessment & Plan: Stable. She is not taking gabapentin  300 mg daily, last time increased to 3 times daily to help with myalgias. Recommend taking gabapentin  300 mg daily at bedtime. Continue good sleep hygiene and low impact exercise. Follow-up in 6 months.  Orders: -     Gabapentin ; Take 1 capsule (300 mg total) by mouth at bedtime.  Sore throat Examination today negative. Could be related to recent EGD and/or GERD. Monitor for new symptoms. Instructed about warning signs.  Essential (primary) hypertension Assessment & Plan: BP adequately controlled. Continue losartan  50 mg daily and low-salt diet. Follow-up in 6 months.  Insomnia, unspecified type Assessment & Plan: Problem is stable. Ambien  CR 12.5 mg 1/2 to 1 tablet daily at bedtime has helped, no changes today. Continue good  sleep hygiene. PDMP reviewed. Follow-up in 5 to 6 months.   I personally spent a total of 45 minutes in the care of the patient today including preparing to see the patient, getting/reviewing separately obtained history, performing a medically appropriate exam/evaluation, counseling and educating, placing orders, and documenting clinical information in the EHR.  Return in about 5 months (around 04/17/2025) for chronic problems.  Leyton Brownlee G. Meliana Canner, MD  [facility]. [place] office.

## 2024-11-13 NOTE — Assessment & Plan Note (Signed)
 BP adequately controlled. Continue losartan  50 mg daily and low-salt diet. Follow-up in 6 months.

## 2024-11-14 NOTE — Telephone Encounter (Signed)
 I called pt and relayed the results of her sleep test.  Moderate OSA, recommend to stay off back. Try dental device first, then pap therapy afterwards. She will go to Dr. Melba with referral for dental device. She had seen the [facility]. Wanted to see results of the sleep study.  Placed on mychart.

## 2024-11-19 ENCOUNTER — Ambulatory Visit: Payer: Self-pay | Admitting: Family Medicine

## 2024-12-27 ENCOUNTER — Ambulatory Visit: Admitting: Dermatology
